# Patient Record
Sex: Female | Born: 1990 | Race: Black or African American | Hispanic: No | Marital: Single | Smoking: Former smoker
Health system: Southern US, Community
[De-identification: ages and names within clinical notes are randomized; demographics above are authoritative.]

## PROBLEM LIST (undated history)

## (undated) DIAGNOSIS — J45909 Unspecified asthma, uncomplicated: Secondary | ICD-10-CM

## (undated) DIAGNOSIS — E119 Type 2 diabetes mellitus without complications: Secondary | ICD-10-CM

---

## 2020-07-19 DIAGNOSIS — Z87898 Personal history of other specified conditions: Secondary | ICD-10-CM | POA: Insufficient documentation

## 2020-07-19 DIAGNOSIS — J45909 Unspecified asthma, uncomplicated: Secondary | ICD-10-CM | POA: Insufficient documentation

## 2020-07-19 DIAGNOSIS — B9689 Other specified bacterial agents as the cause of diseases classified elsewhere: Secondary | ICD-10-CM | POA: Insufficient documentation

## 2020-07-19 DIAGNOSIS — F339 Major depressive disorder, recurrent, unspecified: Secondary | ICD-10-CM | POA: Insufficient documentation

## 2020-07-19 DIAGNOSIS — K5900 Constipation, unspecified: Secondary | ICD-10-CM | POA: Insufficient documentation

## 2020-07-19 DIAGNOSIS — F39 Unspecified mood [affective] disorder: Secondary | ICD-10-CM | POA: Insufficient documentation

## 2020-07-19 DIAGNOSIS — M792 Neuralgia and neuritis, unspecified: Secondary | ICD-10-CM | POA: Insufficient documentation

## 2020-08-17 DIAGNOSIS — E119 Type 2 diabetes mellitus without complications: Secondary | ICD-10-CM | POA: Insufficient documentation

## 2020-08-17 DIAGNOSIS — F50819 Binge eating disorder, unspecified: Secondary | ICD-10-CM | POA: Insufficient documentation

## 2020-08-17 DIAGNOSIS — F17200 Nicotine dependence, unspecified, uncomplicated: Secondary | ICD-10-CM | POA: Insufficient documentation

## 2020-08-19 DIAGNOSIS — F909 Attention-deficit hyperactivity disorder, unspecified type: Secondary | ICD-10-CM | POA: Insufficient documentation

## 2020-08-19 DIAGNOSIS — F259 Schizoaffective disorder, unspecified: Secondary | ICD-10-CM | POA: Insufficient documentation

## 2020-08-19 DIAGNOSIS — F609 Personality disorder, unspecified: Secondary | ICD-10-CM | POA: Insufficient documentation

## 2020-08-19 DIAGNOSIS — F4310 Post-traumatic stress disorder, unspecified: Secondary | ICD-10-CM | POA: Insufficient documentation

## 2020-09-28 ENCOUNTER — Emergency Department (HOSPITAL_COMMUNITY): Payer: Medicaid Other

## 2020-09-28 ENCOUNTER — Encounter (HOSPITAL_COMMUNITY): Payer: Self-pay

## 2020-09-28 ENCOUNTER — Other Ambulatory Visit: Payer: Self-pay

## 2020-09-28 ENCOUNTER — Emergency Department (HOSPITAL_COMMUNITY)
Admission: EM | Admit: 2020-09-28 | Discharge: 2020-09-28 | Disposition: A | Discharge status: Home / Self Care | Payer: Medicaid Other | Attending: Emergency Medicine | Admitting: Emergency Medicine

## 2020-09-28 DIAGNOSIS — R000 Tachycardia, unspecified: Secondary | ICD-10-CM | POA: Insufficient documentation

## 2020-09-28 DIAGNOSIS — Z7984 Long term (current) use of oral hypoglycemic drugs: Secondary | ICD-10-CM | POA: Insufficient documentation

## 2020-09-28 DIAGNOSIS — R0989 Other specified symptoms and signs involving the circulatory and respiratory systems: Secondary | ICD-10-CM | POA: Insufficient documentation

## 2020-09-28 DIAGNOSIS — R0602 Shortness of breath: Secondary | ICD-10-CM | POA: Diagnosis present

## 2020-09-28 DIAGNOSIS — D72829 Elevated white blood cell count, unspecified: Secondary | ICD-10-CM | POA: Diagnosis not present

## 2020-09-28 DIAGNOSIS — R059 Cough, unspecified: Secondary | ICD-10-CM | POA: Diagnosis not present

## 2020-09-28 LAB — COMPREHENSIVE METABOLIC PANEL
ALT: 15 U/L (ref 0–44)
AST: 25 U/L (ref 15–41)
Albumin: 3.8 g/dL (ref 3.5–5.0)
Alkaline Phosphatase: 43 U/L (ref 38–126)
Anion gap: 9 (ref 5–15)
BUN: 8 mg/dL (ref 6–20)
CO2: 26 mmol/L (ref 22–32)
Calcium: 9.5 mg/dL (ref 8.9–10.3)
Chloride: 101 mmol/L (ref 98–111)
Creatinine, Ser: 0.73 mg/dL (ref 0.44–1.00)
GFR, Estimated: >60 mL/min (ref >60)
Glucose, Bld: 191 mg/dL — ABNORMAL HIGH (ref 70–99)
Potassium: 3.8 mmol/L (ref 3.5–5.1)
Sodium: 136 mmol/L (ref 135–145)
Total Bilirubin: <0.1 mg/dL — ABNORMAL LOW (ref 0.3–1.2)
Total Protein: 7.9 g/dL (ref 6.5–8.1)

## 2020-09-28 LAB — CBC WITH DIFFERENTIAL/PLATELET
Abs Immature Granulocytes: 0.07 10*3/uL (ref 0.00–0.07)
Basophils Absolute: 0.1 10*3/uL (ref 0.0–0.1)
Basophils Relative: 0 %
Eosinophils Absolute: 0.3 10*3/uL (ref 0.0–0.5)
Eosinophils Relative: 2 %
HCT: 40 % (ref 36.0–46.0)
Hemoglobin: 12 g/dL (ref 12.0–15.0)
Immature Granulocytes: 1 %
Lymphocytes Relative: 46 %
Lymphs Abs: 6.4 10*3/uL — ABNORMAL HIGH (ref 0.7–4.0)
MCH: 23.6 pg — ABNORMAL LOW (ref 26.0–34.0)
MCHC: 30 g/dL (ref 30.0–36.0)
MCV: 78.7 fL — ABNORMAL LOW (ref 80.0–100.0)
Monocytes Absolute: 0.7 10*3/uL (ref 0.1–1.0)
Monocytes Relative: 5 %
Neutro Abs: 6.6 10*3/uL (ref 1.7–7.7)
Neutrophils Relative %: 46 %
Platelets: 462 10*3/uL — ABNORMAL HIGH (ref 150–400)
RBC: 5.08 MIL/uL (ref 3.87–5.11)
RDW: 18.6 % — ABNORMAL HIGH (ref 11.5–15.5)
WBC: 14.1 10*3/uL — ABNORMAL HIGH (ref 4.0–10.5)
nRBC: 0 % (ref 0.0–0.2)

## 2020-09-28 LAB — HCG, QUANTITATIVE, PREGNANCY: hCG, Beta Chain, Quant, S: <1 m[IU]/mL (ref <5)

## 2020-09-28 MED ORDER — IOHEXOL 350 MG/ML SOLN
100.0000 mL | Freq: Once | INTRAVENOUS | Status: AC | PRN
  Administered 2020-09-28: 100 mL via INTRAVENOUS

## 2020-09-28 MED ORDER — ALBUTEROL SULFATE HFA 108 (90 BASE) MCG/ACT IN AERS
2.0000 | INHALATION_SPRAY | RESPIRATORY_TRACT | Status: DC | PRN
  Administered 2020-09-28: 2 via RESPIRATORY_TRACT
  Filled 2020-09-28: qty 6.7

## 2020-09-28 MED ORDER — ALBUTEROL SULFATE HFA 108 (90 BASE) MCG/ACT IN AERS: 2.0000 | INHALATION_SPRAY | RESPIRATORY_TRACT | Status: DC | PRN

## 2020-09-28 MED ORDER — SODIUM CHLORIDE 0.9 % IV BOLUS
1000.0000 mL | Freq: Once | INTRAVENOUS | Status: AC
  Administered 2020-09-28: 1000 mL via INTRAVENOUS

## 2020-09-28 MED ORDER — ALBUTEROL SULFATE HFA 108 (90 BASE) MCG/ACT IN AERS: 1.0000 | INHALATION_SPRAY | Freq: Four times a day (QID) | RESPIRATORY_TRACT | 0 refills | Status: AC | PRN

## 2020-09-28 NOTE — Discharge Instructions (Addendum)
You are seen in the ER today for your shortness of breath.  Your evaluation in the emergency department was reassuring.  There is no blood clot in your lungs and no signs of infection.  You have been prescribed albuterol inhaler to take as needed at home for wheezing.  Additionally please follow-up with your primary care doctor or with the [facility] listed below who can function as a primary care doctor.  Return to the ER if you develop chest pain, shortness of breath, her heart is racing or feeling palpitations, or develop any new severe symptoms.

## 2020-09-28 NOTE — ED Notes (Signed)
Patient is resting comfortably with family at bedside. 

## 2020-09-28 NOTE — ED Provider Notes (Signed)
[facility]-EMERGENCY DEPT Provider Note   CSN: [id number] Arrival date & time: 09/28/20  0204     History Chief Complaint  Patient presents with  . Shortness of Breath    Melissa Castillo is a 30 y.o. female.  Patient to ED for evaluation of a choking episode on waking tonight. She reports eating chips while in bed. She woke up with a choking sensation like the chips were stuck in her throat and started coughing. She feels she may have aspirated. No recent fever.   The history is provided by the patient. No language interpreter was used.       History reviewed. No pertinent past medical history.  There are no problems to display for this patient.   History reviewed. No pertinent surgical history.   OB History   No obstetric history on file.     History reviewed. No pertinent family history.     Home Medications Prior to Admission medications   Medication Sig Start Date End Date Taking? Authorizing Provider  escitalopram (LEXAPRO) 20 MG tablet Take 1 tablet by mouth daily. 09/21/20  Yes [provider]  gabapentin (NEURONTIN) 300 MG capsule Take 1 capsule by mouth 2 (two) times daily as needed (pain). 09/21/20  Yes [provider]  metFORMIN (GLUCOPHAGE) 500 MG tablet Take 1 tablet by mouth daily. 09/13/20  Yes [provider]  methadone (DOLOPHINE) 10 MG/5ML solution Take 135 mg by mouth daily.   Yes [provider]  QUEtiapine (SEROQUEL) 50 MG tablet Take 50 mg by mouth at bedtime as needed (sleep). 07/13/20  Yes [provider]    Allergies    Patient has no known allergies.  Review of Systems   Review of Systems  Constitutional: Negative for chills and fever.  HENT: Negative.   Respiratory: Positive for cough, choking and shortness of breath.   Cardiovascular: Negative.   Gastrointestinal: Negative.  Negative for vomiting.  Musculoskeletal: Negative.   Skin: Negative.   Neurological: Negative.      Physical Exam Updated Vital Signs BP 129/63 (BP Location: Right Arm)   Pulse (!) 117   Temp 99 F (37.2 C) (Oral)   Resp (!) 22   LMP  (LMP Unknown)   SpO2 94%   Physical Exam Vitals and nursing note reviewed.  Constitutional:      Appearance: She is well-developed. She is obese.  HENT:     Head: Normocephalic.  Cardiovascular:     Rate and Rhythm: Regular rhythm. Tachycardia present.  Pulmonary:     Effort: Pulmonary effort is normal.     Breath sounds: Normal breath sounds. No wheezing, rhonchi or rales.  Abdominal:     General: Bowel sounds are normal.     Palpations: Abdomen is soft.     Tenderness: There is no abdominal tenderness. There is no guarding or rebound.  Musculoskeletal:        General: Normal range of motion.     Cervical back: Normal range of motion and neck supple.     Right lower leg: No tenderness.     Left lower leg: No tenderness.  Skin:    General: Skin is warm and dry.     Findings: No rash.  Neurological:     Mental Status: She is alert.     ED Results / Procedures / Treatments   Labs (all labs ordered are listed, but only abnormal results are displayed) Labs Reviewed  CBC WITH DIFFERENTIAL/PLATELET  COMPREHENSIVE METABOLIC PANEL  Results for orders placed or performed during the hospital encounter of 09/28/20  CBC with Differential  Result Value Ref Range   WBC 14.1 (H) 4.0 - 10.5 K/uL   RBC 5.08 3.87 - 5.11 MIL/uL   Hemoglobin 12.0 12.0 - 15.0 g/dL   HCT 97.5 88.3 - 25.4 %   MCV 78.7 (L) 80.0 - 100.0 fL   MCH 23.6 (L) 26.0 - 34.0 pg   MCHC 30.0 30.0 - 36.0 g/dL   RDW 98.2 (H) 64.1 - 58.3 %   Platelets 462 (H) 150 - 400 K/uL   nRBC 0.0 0.0 - 0.2 %   Neutrophils Relative % 46 %   Neutro Abs 6.6 1.7 - 7.7 K/uL   Lymphocytes Relative 46 %   Lymphs Abs 6.4 (H) 0.7 - 4.0 K/uL   Monocytes Relative 5 %   Monocytes Absolute 0.7 0.1 - 1.0 K/uL   Eosinophils Relative 2 %   Eosinophils Absolute 0.3 0.0 - 0.5 K/uL   Basophils  Relative 0 %   Basophils Absolute 0.1 0.0 - 0.1 K/uL   Immature Granulocytes 1 %   Abs Immature Granulocytes 0.07 0.00 - 0.07 K/uL  Comprehensive metabolic panel  Result Value Ref Range   Sodium 136 135 - 145 mmol/L   Potassium 3.8 3.5 - 5.1 mmol/L   Chloride 101 98 - 111 mmol/L   CO2 26 22 - 32 mmol/L   Glucose, Bld 191 (H) 70 - 99 mg/dL   BUN 8 6 - 20 mg/dL   Creatinine, Ser 0.94 0.44 - 1.00 mg/dL   Calcium 9.5 8.9 - 07.6 mg/dL   Total Protein 7.9 6.5 - 8.1 g/dL   Albumin 3.8 3.5 - 5.0 g/dL   AST 25 15 - 41 U/L   ALT 15 0 - 44 U/L   Alkaline Phosphatase 43 38 - 126 U/L   Total Bilirubin <0.1 (L) 0.3 - 1.2 mg/dL   GFR, Estimated >80 >88 mL/min   Anion gap 9 5 - 15    EKG EKG Interpretation  Date/Time:  Wednesday September 28 2020 04:32:36 EDT Ventricular Rate:  118 PR Interval:  141 QRS Duration: 88 QT Interval:  345 QTC Calculation: 484 R Axis:   25 Text Interpretation: Sinus tachycardia Borderline T wave abnormalities Borderline prolonged QT interval No significant change was found Confirmed by Dione Booze ([id number]) on 09/28/2020 4:42:04 AM   Radiology DG Chest 2 View  Result Date: 09/28/2020 CLINICAL DATA:  Sudden onset wheezing and shortness of breath EXAM: CHEST - 2 VIEW COMPARISON:  None. FINDINGS: Low volume chest with interstitial crowding. No focal opacity, edema, effusion, or air leak. Normal heart size. Artifact from EKG leads. IMPRESSION: Limited low volume chest.  No focal airspace disease or edema. Electronically Signed   By: Marnee Spring M.D.   On: 09/28/2020 04:54    Procedures Procedures   Medications Ordered in ED Medications  albuterol (VENTOLIN HFA) 108 (90 Base) MCG/ACT inhaler 2 puff (has no administration in time range)    ED Course  I have reviewed the triage vital signs and the nursing notes.  Pertinent labs & imaging results that were available during my care of the patient were reviewed by me and considered in my medical decision making  (see chart for details).    MDM Rules/Calculators/A&P                          Patient to ED after choking episode earlier this evening.  CXR negative for signs of aspiration. The patient has been sleeping on multiple rechecks. She is persistently tachycardic to 118-120. NAD.   CBC with mild leukocystosis of 14.1, otherwise labs are negative. UA pending. EKG non-ischemic.   She reports she drove from [place] within the last 2 weeks. With SOB episode and persistent tachycardia, consider PE. CTA ordered for further evaluation. This was discussed with the patient who is comfortable with the study and has no questions.   Patient care signed out to [facility], PA-C, pending CTA results. Anticipate discharge home. She will need an inhaler on discharge and referral to PCP.   Final Clinical Impression(s) / ED Diagnoses Final diagnoses:  None   1. SOB 2. Tachycardia   Rx / DC Orders ED Discharge Orders    None       Danne Harbor 09/28/20 0711    Dione Booze, MD 09/28/20 [phone]

## 2020-09-28 NOTE — ED Triage Notes (Signed)
Pt BIB EMS Per EMS pt came from home, she was sleeping, woke up around 1 am. She vomited in her sleep and feels that she aspirated. Pt states that she couldn't breath. Bilateral wheezing noted, started on albuterol treatment. Oxygen saturation at 99% after treatment. Total of 2 neb treatments by EMS. Prescription for inhaler at home but has been without it due to insurance issues.   Vitals 132/86 BP 110 HR  99% room air

## 2020-09-28 NOTE — ED Provider Notes (Signed)
  Physical Exam  BP 138/78 (BP Location: Right Arm)   Pulse (!) 101   Temp (!) 97.2 F (36.2 C) (Oral)   Resp 19   LMP 09/05/2020 Comment: negative HCG blood test 09-28-2020  SpO2 99%   Physical Exam  ED Course/Procedures     Procedures  MDM   Care of this patient assumed from preceding ED provider, Elpidio Anis, PA-C at time of shift change.  Please see her associated note for further insight into the patient's ED course.  In brief patient experienced episode of shortness of breath last night sensation that she was choking.  She was wheezing upon arrival and given nebulizer treatment.  Patient's been persistently tachycardic throughout her stay in the ED and recently traveled from [place] by car.  For this reason CT angiogram was ordered to rule out pulmonary embolus.  This study is pending at time of shift change; if negative disposition plan likely discharge home with albuterol inhaler and PCP follow-up.  CTA negative for acute pulmonary embolus but does have some right-sided atelectasis.  Tachycardia improved to 101 bpm previously in the 120s.  Given reassuring vital signs and CTA today in the emergency department, no further work-up is warranted at this time.  Will discharge with albuterol inhaler and PCP follow-up.  Melissa Castillo voiced understanding of her medical evaluation and treatment plan.  Each of her questions was answered to her expressed satisfaction.  Strict return precautions given.  Patient is well-appearing, stable, and appropriate for discharge at this time.  This chart was dictated using voice recognition software, Dragon. Despite the best efforts of this provider to proofread and correct errors, errors may still occur which can change documentation meaning.    Sherrilee Gilles 09/28/20 2725    [name], MD 09/30/20 [phone]

## 2021-01-04 DIAGNOSIS — S3992XA Unspecified injury of lower back, initial encounter: Secondary | ICD-10-CM | POA: Insufficient documentation

## 2021-01-04 DIAGNOSIS — F1120 Opioid dependence, uncomplicated: Secondary | ICD-10-CM | POA: Insufficient documentation

## 2021-04-06 ENCOUNTER — Other Ambulatory Visit: Payer: Self-pay | Admitting: Orthopedic Surgery

## 2021-04-06 DIAGNOSIS — M545 Low back pain, unspecified: Secondary | ICD-10-CM

## 2021-04-06 DIAGNOSIS — M542 Cervicalgia: Secondary | ICD-10-CM

## 2021-04-26 ENCOUNTER — Other Ambulatory Visit: Payer: Medicaid Other

## 2021-04-28 ENCOUNTER — Ambulatory Visit
Admission: RE | Admit: 2021-04-28 | Discharge: 2021-04-28 | Disposition: A | Discharge status: Home / Self Care | Payer: Medicaid Other | Source: Ambulatory Visit | Attending: Orthopedic Surgery | Admitting: Orthopedic Surgery

## 2021-04-28 ENCOUNTER — Other Ambulatory Visit: Payer: Self-pay

## 2021-04-28 DIAGNOSIS — M545 Low back pain, unspecified: Secondary | ICD-10-CM

## 2021-04-28 DIAGNOSIS — M542 Cervicalgia: Secondary | ICD-10-CM

## 2021-08-18 ENCOUNTER — Telehealth: Payer: Medicaid Other | Admitting: Family

## 2021-08-18 NOTE — Progress Notes (Signed)
Attempted to connect with patient. Called and her current phone number is not active. Will close visit.  ? ?Jannifer Rodney, FNP ? ?

## 2021-08-21 ENCOUNTER — Ambulatory Visit (HOSPITAL_COMMUNITY)
Admission: EM | Admit: 2021-08-21 | Discharge: 2021-08-21 | Discharge status: Against Medical Advice | Payer: Medicaid Other

## 2021-10-15 ENCOUNTER — Emergency Department (HOSPITAL_COMMUNITY)
Admission: EM | Admit: 2021-10-15 | Discharge: 2021-10-15 | Disposition: A | Discharge status: Home / Self Care | Payer: Medicaid Other | Attending: Emergency Medicine | Admitting: Emergency Medicine

## 2021-10-15 DIAGNOSIS — K029 Dental caries, unspecified: Secondary | ICD-10-CM | POA: Diagnosis not present

## 2021-10-15 DIAGNOSIS — Z7984 Long term (current) use of oral hypoglycemic drugs: Secondary | ICD-10-CM | POA: Insufficient documentation

## 2021-10-15 DIAGNOSIS — K0889 Other specified disorders of teeth and supporting structures: Secondary | ICD-10-CM

## 2021-10-15 MED ORDER — CLINDAMYCIN HCL 300 MG PO CAPS: 300.0000 mg | ORAL_CAPSULE | Freq: Three times a day (TID) | ORAL | 0 refills | Status: AC

## 2021-10-15 MED ORDER — CLINDAMYCIN HCL 150 MG PO CAPS
300.0000 mg | ORAL_CAPSULE | Freq: Once | ORAL | Status: AC
  Administered 2021-10-15: 300 mg via ORAL
  Filled 2021-10-15: qty 2

## 2021-10-15 MED ORDER — OXYCODONE HCL 5 MG PO TABS: 5.0000 mg | ORAL_TABLET | Freq: Four times a day (QID) | ORAL | 0 refills | Status: DC | PRN

## 2021-10-15 MED ORDER — HYDROCODONE-ACETAMINOPHEN 5-325 MG PO TABS
1.0000 | ORAL_TABLET | Freq: Once | ORAL | Status: AC
  Administered 2021-10-15: 1 via ORAL
  Filled 2021-10-15: qty 1

## 2021-10-15 NOTE — ED Provider Notes (Signed)
?[facility] EMERGENCY DEPARTMENT ?Provider Note ? ? ?CSN: [id number] ?Arrival date & time: 10/15/21  1554 ? ?  ? ?History ? ?Chief Complaint  ?Patient presents with  ? Dental Pain  ? ? ?Melissa Castillo is a 31 y.o. female. ? ?The history is provided by the patient.  ?Dental Pain ?Location:  Lower ?Quality:  Constant ?Severity:  Mild ?Onset quality:  Gradual ?Timing:  Constant ?Progression:  Unchanged ?Chronicity:  New ?Context: dental caries   ?Relieved by:  Nothing ?Worsened by:  Nothing ?Associated symptoms: no congestion, no difficulty swallowing, no drooling, no facial pain, no facial swelling, no fever, no gum swelling, no headaches, no neck pain, no neck swelling, no oral bleeding, no oral lesions and no trismus   ? ?  ? ?Home Medications ?Prior to Admission medications   ?Medication Sig Start Date End Date Taking? Authorizing Provider  ?clindamycin (CLEOCIN) 300 MG capsule Take 1 capsule (300 mg total) by mouth 3 (three) times daily for 10 days. 10/15/21 10/25/21 Yes Arlett Goold, DO  ?albuterol (VENTOLIN HFA) 108 (90 Base) MCG/ACT inhaler Inhale 1-2 puffs into the lungs every 6 (six) hours as needed for wheezing or shortness of breath. 09/28/20   Sponseller, Lupe Carney R, PA-C  ?escitalopram (LEXAPRO) 20 MG tablet Take 20 mg by mouth daily. 09/21/20   [provider]  ?gabapentin (NEURONTIN) 300 MG capsule Take 300 mg by mouth 2 (two) times daily as needed (pain). 09/21/20   [provider]  ?metFORMIN (GLUCOPHAGE) 500 MG tablet Take 500 mg by mouth daily. 09/13/20   [provider]  ?methadone (DOLOPHINE) 10 MG/5ML solution Take 115 mg by mouth daily. Per [facility] LPN verified @ [place] [phone] had one take home dose left for today    [provider]  ?QUEtiapine (SEROQUEL) 50 MG tablet Take 50 mg by mouth at bedtime as needed (sleep). 07/13/20   [provider]  ?   ? ?Allergies    ?Patient has no known allergies.   ? ?Review of Systems   ?Review of  Systems  ?Constitutional:  Negative for fever.  ?HENT:  Negative for congestion, drooling, facial swelling and mouth sores.   ?Musculoskeletal:  Negative for neck pain.  ?Neurological:  Negative for headaches.  ? ?Physical Exam ?Updated Vital Signs ?BP (!) 151/99 (BP Location: Right Arm)   Pulse 100   Temp 98 ?F (36.7 ?C) (Oral)   Resp 20   SpO2 100%  ?Physical Exam ?Constitutional:   ?   General: She is not in acute distress. ?   Appearance: She is not ill-appearing.  ?HENT:  ?   Head: Normocephalic and atraumatic.  ?   Comments: Patient with dental caries throughout on exam but particularly in the right lower teeth ?   Nose: Nose normal.  ?   Mouth/Throat:  ?   Mouth: Mucous membranes are moist.  ?   Pharynx: No oropharyngeal exudate.  ?Eyes:  ?   Extraocular Movements: Extraocular movements intact.  ?   Pupils: Pupils are equal, round, and reactive to light.  ?Cardiovascular:  ?   Pulses: Normal pulses.  ?Musculoskeletal:  ?   Cervical back: Normal range of motion.  ?Lymphadenopathy:  ?   Cervical: No cervical adenopathy.  ?Neurological:  ?   Mental Status: She is alert.  ? ? ?ED Results / Procedures / Treatments   ?Labs ?(all labs ordered are listed, but only abnormal results are displayed) ?Labs Reviewed - No data to display ? ?EKG ?None ? ?  Radiology ?No results found. ? ?Procedures ?Procedures  ? ? ?Medications Ordered in ED ?Medications  ?HYDROcodone-acetaminophen (NORCO/VICODIN) 5-325 MG per tablet 1 tablet (1 tablet Oral Given 10/15/21 1618)  ?clindamycin (CLEOCIN) capsule 300 mg (300 mg Oral Given 10/15/21 1618)  ? ? ?ED Course/ Medical Decision Making/ A&P ?  ?                        ?Medical Decision Making ?Risk ?Prescription drug management. ? ? ?Patient here with dental pain.  No signs of abscess on exam.  Some dental caries especially in the right lower mouth.  Will prescribe clindamycin.  Will give resources for dentistry.  No concern for deep space infection. ? ? ? ? ? ? ? ?Final Clinical  Impression(s) / ED Diagnoses ?Final diagnoses:  ?Pain, dental  ? ? ?Rx / DC Orders ?ED Discharge Orders   ? ?      Ordered  ?  oxyCODONE (ROXICODONE) 5 MG immediate release tablet  Every 6 hours PRN,   Status:  Discontinued       ? 10/15/21 1616  ?  clindamycin (CLEOCIN) 300 MG capsule  3 times daily       ? 10/15/21 1616  ? ?  ?  ? ?  ? ? ?  ?Virgina Norfolk, DO ?10/15/21 1634 ? ?

## 2021-10-15 NOTE — Discharge Instructions (Addendum)
Follow-up with dentist.  Take antibiotic as prescribed.   Recommend Tylenol and ibuprofen for pain as well. ?

## 2021-10-15 NOTE — ED Triage Notes (Signed)
Dental pain x 3 days radiating to right ear. Hx of dental abscess with the same presentation. Airway intact.  ?

## 2022-10-11 DIAGNOSIS — F3131 Bipolar disorder, current episode depressed, mild: Secondary | ICD-10-CM | POA: Diagnosis not present

## 2022-10-11 DIAGNOSIS — F4310 Post-traumatic stress disorder, unspecified: Secondary | ICD-10-CM | POA: Diagnosis not present

## 2022-11-27 DIAGNOSIS — E119 Type 2 diabetes mellitus without complications: Secondary | ICD-10-CM | POA: Diagnosis not present
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
## 2023-04-08 DIAGNOSIS — R29818 Other symptoms and signs involving the nervous system: Secondary | ICD-10-CM | POA: Insufficient documentation

## 2023-04-08 DIAGNOSIS — Z720 Tobacco use: Secondary | ICD-10-CM | POA: Insufficient documentation

## 2023-04-13 DIAGNOSIS — E1140 Type 2 diabetes mellitus with diabetic neuropathy, unspecified: Secondary | ICD-10-CM | POA: Diagnosis not present

## 2023-04-13 DIAGNOSIS — K219 Gastro-esophageal reflux disease without esophagitis: Secondary | ICD-10-CM | POA: Diagnosis not present

## 2023-04-13 DIAGNOSIS — Z833 Family history of diabetes mellitus: Secondary | ICD-10-CM | POA: Diagnosis not present

## 2023-04-13 DIAGNOSIS — Z008 Encounter for other general examination: Secondary | ICD-10-CM | POA: Diagnosis not present

## 2023-04-13 DIAGNOSIS — F259 Schizoaffective disorder, unspecified: Secondary | ICD-10-CM | POA: Diagnosis not present

## 2023-04-13 DIAGNOSIS — R030 Elevated blood-pressure reading, without diagnosis of hypertension: Secondary | ICD-10-CM | POA: Diagnosis not present

## 2023-04-13 DIAGNOSIS — Z7951 Long term (current) use of inhaled steroids: Secondary | ICD-10-CM | POA: Diagnosis not present

## 2023-04-13 DIAGNOSIS — Z809 Family history of malignant neoplasm, unspecified: Secondary | ICD-10-CM | POA: Diagnosis not present

## 2023-04-13 DIAGNOSIS — Z91040 Latex allergy status: Secondary | ICD-10-CM | POA: Diagnosis not present

## 2023-04-13 DIAGNOSIS — J45909 Unspecified asthma, uncomplicated: Secondary | ICD-10-CM | POA: Diagnosis not present

## 2023-04-13 DIAGNOSIS — F1120 Opioid dependence, uncomplicated: Secondary | ICD-10-CM | POA: Diagnosis not present

## 2023-04-13 DIAGNOSIS — Z7985 Long-term (current) use of injectable non-insulin antidiabetic drugs: Secondary | ICD-10-CM | POA: Diagnosis not present

## 2023-04-13 DIAGNOSIS — Z720 Tobacco use: Secondary | ICD-10-CM | POA: Diagnosis not present

## 2023-04-16 DIAGNOSIS — F1120 Opioid dependence, uncomplicated: Secondary | ICD-10-CM | POA: Diagnosis not present

## 2023-04-22 DIAGNOSIS — F1120 Opioid dependence, uncomplicated: Secondary | ICD-10-CM | POA: Diagnosis not present

## 2023-04-29 DIAGNOSIS — F1120 Opioid dependence, uncomplicated: Secondary | ICD-10-CM | POA: Diagnosis not present

## 2023-05-06 DIAGNOSIS — F1120 Opioid dependence, uncomplicated: Secondary | ICD-10-CM | POA: Diagnosis not present

## 2023-05-13 DIAGNOSIS — F1120 Opioid dependence, uncomplicated: Secondary | ICD-10-CM | POA: Diagnosis not present

## 2023-05-20 DIAGNOSIS — F1120 Opioid dependence, uncomplicated: Secondary | ICD-10-CM | POA: Diagnosis not present

## 2023-05-24 DIAGNOSIS — F1120 Opioid dependence, uncomplicated: Secondary | ICD-10-CM | POA: Diagnosis not present

## 2023-05-27 DIAGNOSIS — F1120 Opioid dependence, uncomplicated: Secondary | ICD-10-CM | POA: Diagnosis not present

## 2023-06-03 DIAGNOSIS — F1120 Opioid dependence, uncomplicated: Secondary | ICD-10-CM | POA: Diagnosis not present

## 2023-06-10 DIAGNOSIS — F1120 Opioid dependence, uncomplicated: Secondary | ICD-10-CM | POA: Diagnosis not present

## 2023-06-17 DIAGNOSIS — F1120 Opioid dependence, uncomplicated: Secondary | ICD-10-CM | POA: Diagnosis not present

## 2023-06-24 DIAGNOSIS — F1120 Opioid dependence, uncomplicated: Secondary | ICD-10-CM | POA: Diagnosis not present

## 2023-07-01 DIAGNOSIS — F1120 Opioid dependence, uncomplicated: Secondary | ICD-10-CM | POA: Diagnosis not present

## 2023-07-08 DIAGNOSIS — F1120 Opioid dependence, uncomplicated: Secondary | ICD-10-CM | POA: Diagnosis not present

## 2023-07-25 ENCOUNTER — Encounter (HOSPITAL_COMMUNITY): Payer: Self-pay

## 2023-07-25 ENCOUNTER — Emergency Department (HOSPITAL_COMMUNITY): Payer: 59

## 2023-07-25 ENCOUNTER — Emergency Department (HOSPITAL_COMMUNITY)
Admission: EM | Admit: 2023-07-25 | Discharge: 2023-07-25 | Disposition: A | Discharge status: Home / Self Care | Payer: 59 | Attending: Emergency Medicine | Admitting: Emergency Medicine

## 2023-07-25 ENCOUNTER — Other Ambulatory Visit: Payer: Self-pay

## 2023-07-25 DIAGNOSIS — Z20822 Contact with and (suspected) exposure to covid-19: Secondary | ICD-10-CM | POA: Insufficient documentation

## 2023-07-25 DIAGNOSIS — R519 Headache, unspecified: Secondary | ICD-10-CM | POA: Insufficient documentation

## 2023-07-25 LAB — RESP PANEL BY RT-PCR (RSV, FLU A&B, COVID)  RVPGX2
Influenza A by PCR: NEGATIVE
Influenza B by PCR: NEGATIVE
Resp Syncytial Virus by PCR: NEGATIVE
SARS Coronavirus 2 by RT PCR: NEGATIVE

## 2023-07-25 MED ORDER — DEXAMETHASONE SODIUM PHOSPHATE 10 MG/ML IJ SOLN
10.0000 mg | Freq: Once | INTRAMUSCULAR | Status: AC
  Administered 2023-07-25: 10 mg via INTRAMUSCULAR
  Filled 2023-07-25: qty 1

## 2023-07-25 MED ORDER — PROCHLORPERAZINE MALEATE 10 MG PO TABS
10.0000 mg | ORAL_TABLET | Freq: Once | ORAL | Status: AC
  Administered 2023-07-25: 10 mg via ORAL
  Filled 2023-07-25: qty 1

## 2023-07-25 MED ORDER — ONDANSETRON 4 MG PO TBDP
4.0000 mg | ORAL_TABLET | Freq: Once | ORAL | Status: AC
  Administered 2023-07-25: 4 mg via ORAL
  Filled 2023-07-25: qty 1

## 2023-07-25 MED ORDER — KETOROLAC TROMETHAMINE 30 MG/ML IJ SOLN
30.0000 mg | Freq: Once | INTRAMUSCULAR | Status: AC
  Administered 2023-07-25: 30 mg via INTRAMUSCULAR
  Filled 2023-07-25: qty 1

## 2023-07-25 MED ORDER — ACETAMINOPHEN 500 MG PO TABS
1000.0000 mg | ORAL_TABLET | Freq: Once | ORAL | Status: AC
  Administered 2023-07-25: 1000 mg via ORAL
  Filled 2023-07-25: qty 2

## 2023-07-25 NOTE — ED Provider Notes (Signed)
Accepted handoff at shift change from [facility]. Please see prior provider note for full HPI.  Briefly: Patient is a 33 y.o. female who presents to the ER for bad headache since last night. Tried tylenol without relief.  DDX/Plan: received toradol and decadron around 0445. On reevaluation, no improvement. Plan for more meds and reevaluate. CT head looks unremarkable, no meningeal signs.   Physical Exam  BP (!) 152/73   Pulse (!) 104   Temp 98.3 F (36.8 C) (Oral)   Resp 18   Ht 5\' 7"  (1.702 m)   Wt 122.9 kg   LMP 07/10/2023   SpO2 97%   BMI 42.44 kg/m   Physical Exam Vitals and nursing note reviewed.  Constitutional:      Appearance: Normal appearance.  HENT:     Head: Normocephalic and atraumatic.  Eyes:     Conjunctiva/sclera: Conjunctivae normal.  Pulmonary:     Effort: Pulmonary effort is normal. No respiratory distress.  Musculoskeletal:     Cervical back: Full passive range of motion without pain.  Skin:    General: Skin is warm and dry.  Neurological:     Mental Status: She is alert.  Psychiatric:        Mood and Affect: Mood normal.        Behavior: Behavior normal.    ED Course / MDM    Medical Decision Making Amount and/or Complexity of Data Reviewed Radiology: ordered.  Risk OTC drugs. Prescription drug management.  0730 -- pt resting comfortably in exam bed. States headache is improved but still there, still feels "wobbly" when she stands up. Mentions nausea and nasal congestion. Is requesting I call her methadone clinic to let them know she missed her appointment. Will order tylenol and zofran.   3244 -- After consideration of the diagnostic results and the patients response to treatment, I feel that emergency department workup does not suggest an emergent condition requiring admission or immediate intervention beyond what has been performed at this time. The plan is: discharge to home, recommend continuing OTC meds for bad headache. Suspect she  has viral infection, could be too early for RVP accuracy. No fever, no meningeal signs. The patient is safe for discharge and has been instructed to return immediately for worsening symptoms, change in symptoms or any other concerns.   Su Monks, PA-C 07/25/23 0826    Melene Plan, DO 07/25/23 1234

## 2023-07-25 NOTE — ED Provider Notes (Signed)
[place] EMERGENCY DEPARTMENT AT [facility] Provider Note   CSN: [id number] Arrival date & time: 07/25/23  0353     History  Chief Complaint  Patient presents with   Headache    Melissa Castillo is a 33 y.o. female.  Patient presents to the emergency department complaining of a headache.  Patient describes a headache which began at approximately 10 PM, severe, throbbing, behind the eyes.  She endorses trying Tylenol with little relief.  The patient endorses starting nicotine patches over the past few days to help stop smoking and states that she has been using them according to the label.  She denies fever, neck pain, neck stiffness.  She does endorse photophobia.  Past medical history significant for asthma, neuralgia and neuritis, unspecified mood disorder, major depressive disorder   Headache      Home Medications Prior to Admission medications   Medication Sig Start Date End Date Taking? Authorizing Provider  albuterol (VENTOLIN HFA) 108 (90 Base) MCG/ACT inhaler Inhale 1-2 puffs into the lungs every 6 (six) hours as needed for wheezing or shortness of breath. 09/28/20   Sponseller, Lupe Carney R, PA-C  escitalopram (LEXAPRO) 20 MG tablet Take 20 mg by mouth daily. 09/21/20   [provider]  gabapentin (NEURONTIN) 300 MG capsule Take 300 mg by mouth 2 (two) times daily as needed (pain). 09/21/20   [provider]  metFORMIN (GLUCOPHAGE) 500 MG tablet Take 500 mg by mouth daily. 09/13/20   [provider]  methadone (DOLOPHINE) 10 MG/5ML solution Take 115 mg by mouth daily. Per [facility] LPN verified @ [place] [phone] had one take home dose left for today    [provider]  QUEtiapine (SEROQUEL) 50 MG tablet Take 50 mg by mouth at bedtime as needed (sleep). 07/13/20   [provider]      Allergies    Patient has no known allergies.    Review of Systems   Review of Systems  Neurological:  Positive for headaches.     Physical Exam Updated Vital Signs BP 134/83 (BP Location: Right Arm)   Pulse (!) 105   Temp 98.1 F (36.7 C) (Oral)   Resp 18   Ht 5\' 7"  (1.702 m)   Wt 122.9 kg   LMP 07/10/2023   SpO2 100%   BMI 42.44 kg/m  Physical Exam Vitals and nursing note reviewed.  Constitutional:      Appearance: She is obese.  HENT:     Head: Normocephalic and atraumatic.  Eyes:     Conjunctiva/sclera: Conjunctivae normal.     Pupils: Pupils are equal, round, and reactive to light.  Cardiovascular:     Rate and Rhythm: Normal rate and regular rhythm.  Pulmonary:     Effort: Pulmonary effort is normal. No respiratory distress.     Breath sounds: Normal breath sounds.  Abdominal:     Palpations: Abdomen is soft.     Tenderness: There is no abdominal tenderness.  Musculoskeletal:        General: No swelling.     Cervical back: Normal range of motion and neck supple. No rigidity.  Skin:    General: Skin is warm and dry.     Capillary Refill: Capillary refill takes less than 2 seconds.  Neurological:     Mental Status: She is alert.  Psychiatric:        Mood and Affect: Mood normal.     ED Results / Procedures / Treatments   Labs (all labs  ordered are listed, but only abnormal results are displayed) Labs Reviewed  RESP PANEL BY RT-PCR (RSV, FLU A&B, COVID)  RVPGX2  POC URINE PREG, ED    EKG None  Radiology CT Head Wo Contrast Result Date: 07/25/2023 CLINICAL DATA:  Headache, sudden and severe. Headache behind the eyes EXAM: CT HEAD WITHOUT CONTRAST TECHNIQUE: Contiguous axial images were obtained from the base of the skull through the vertex without intravenous contrast. RADIATION DOSE REDUCTION: This exam was performed according to the departmental dose-optimization program which includes automated exposure control, adjustment of the mA and/or kV according to patient size and/or use of iterative reconstruction technique. COMPARISON:  None Available. FINDINGS: Brain: No evidence of  acute infarction, hemorrhage, hydrocephalus, extra-axial collection or mass effect. 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 Vascular: No hyperdense vessel or unexpected calcification. Skull: Normal. Negative for fracture or focal lesion. Sinuses/Orbits: No acute finding.  Clear paranasal sinuses IMPRESSION: No acute finding or specific cause for headache. Electronically Signed   By: Tiburcio Pea M.D.   On: 07/25/2023 05:08    Procedures Procedures    Medications Ordered in ED Medications  prochlorperazine (COMPAZINE) tablet 10 mg (has no administration in time range)  ketorolac (TORADOL) 30 MG/ML injection 30 mg (30 mg Intramuscular Given 07/25/23 0448)  dexamethasone (DECADRON) injection 10 mg (10 mg Intramuscular Given 07/25/23 0447)    ED Course/ Medical Decision Making/ A&P                                 Medical Decision Making Amount and/or Complexity of Data Reviewed Radiology: ordered.  Risk Prescription drug management.   This patient presents to the ED for concern of headache, this involves an extensive number of treatment options, and is a complaint that carries with it a high risk of complications and morbidity.  The differential diagnosis includes tension headache, migraine, cluster headache, intracranial abnormality, meningitis, others   Co morbidities that complicate the patient evaluation  Obesity, neuralgia   Additional history obtained:   External records from outside source obtained and reviewed including outside health notes showing medical history   Imaging Studies ordered:  I ordered imaging studies including CT head without contrast I independently visualized and interpreted imaging which showed no acute findings I agree with the radiologist interpretation   Problem List / ED Course / Critical interventions / Medication management   I ordered medication including Toradol,  Decadron, Compazine for headache Reevaluation of the patient after these medicines showed that the patient stayed the same I have reviewed the patients home medicines and have made adjustments as needed   Test / Admission - Considered:  Patient care being transferred to Lorin Roemhildt, PA-C.  Disposition pending reassessment after medication, results of testing         Final Clinical Impression(s) / ED Diagnoses Final diagnoses:  None    Rx / DC Orders ED Discharge Orders     None         Pamala Duffel 07/25/23 1610    Tilden Fossa, MD 07/25/23 [phone]

## 2023-07-25 NOTE — Discharge Instructions (Addendum)
You are seen in the emergency department for a headache.  I am glad that you were starting to have some improvement with medications given.  I suspect you likely have a viral infection which could be contributing to your headache, as you have been having some nasal congestion.  You tested negative for flu, COVID, and RSV.  However I have seen several folks with either a different virus, or it is too early for you to test positive.  I recommend taking up to 800 mg of ibuprofen every 6 hours, and up to 1000 mg of Tylenol every 6 hours.  You can alternate the 2.  That would mean that you got a dose of Tylenol at 7:30am, you can take a dose of ibuprofen at 10:30am, and dose of Tylenol again at 1:30pm.  I called your methadone clinic.  They request that you bring her hospital paperwork to them tomorrow at your scheduled appointment.  The CT scan of your head was normal.   Continue to monitor how you're doing and return to the ER for new or worsening symptoms.

## 2023-07-25 NOTE — ED Triage Notes (Signed)
Complaining of a headache behind her eyes that started around 11 last night. Said that it is throbbing, had tylenol at 10 pm.  York Spaniel she recently started to use nicotine patches to help stop smoking cigarette.

## 2023-07-26 ENCOUNTER — Other Ambulatory Visit: Payer: Self-pay

## 2023-07-26 ENCOUNTER — Encounter (HOSPITAL_COMMUNITY): Payer: Self-pay

## 2023-07-26 ENCOUNTER — Observation Stay (HOSPITAL_COMMUNITY)
Admission: EM | Admit: 2023-07-26 | Discharge: 2023-07-28 | Disposition: A | Discharge status: Home / Self Care | Payer: 59 | Attending: Internal Medicine | Admitting: Internal Medicine

## 2023-07-26 DIAGNOSIS — Z6841 Body Mass Index (BMI) 40.0 and over, adult: Secondary | ICD-10-CM | POA: Diagnosis not present

## 2023-07-26 DIAGNOSIS — N949 Unspecified condition associated with female genital organs and menstrual cycle: Secondary | ICD-10-CM

## 2023-07-26 DIAGNOSIS — E66813 Obesity, class 3: Secondary | ICD-10-CM | POA: Diagnosis not present

## 2023-07-26 DIAGNOSIS — F909 Attention-deficit hyperactivity disorder, unspecified type: Secondary | ICD-10-CM | POA: Insufficient documentation

## 2023-07-26 DIAGNOSIS — G030 Nonpyogenic meningitis: Principal | ICD-10-CM | POA: Diagnosis present

## 2023-07-26 DIAGNOSIS — Z79891 Long term (current) use of opiate analgesic: Secondary | ICD-10-CM | POA: Diagnosis not present

## 2023-07-26 DIAGNOSIS — J45909 Unspecified asthma, uncomplicated: Secondary | ICD-10-CM | POA: Insufficient documentation

## 2023-07-26 DIAGNOSIS — Z79899 Other long term (current) drug therapy: Secondary | ICD-10-CM | POA: Diagnosis not present

## 2023-07-26 DIAGNOSIS — Z1152 Encounter for screening for COVID-19: Secondary | ICD-10-CM | POA: Insufficient documentation

## 2023-07-26 DIAGNOSIS — R519 Headache, unspecified: Secondary | ICD-10-CM | POA: Diagnosis present

## 2023-07-26 DIAGNOSIS — Z7985 Long-term (current) use of injectable non-insulin antidiabetic drugs: Secondary | ICD-10-CM | POA: Insufficient documentation

## 2023-07-26 DIAGNOSIS — G039 Meningitis, unspecified: Secondary | ICD-10-CM | POA: Diagnosis present

## 2023-07-26 DIAGNOSIS — N9089 Other specified noninflammatory disorders of vulva and perineum: Secondary | ICD-10-CM | POA: Diagnosis not present

## 2023-07-26 DIAGNOSIS — E119 Type 2 diabetes mellitus without complications: Secondary | ICD-10-CM | POA: Insufficient documentation

## 2023-07-26 DIAGNOSIS — Z87891 Personal history of nicotine dependence: Secondary | ICD-10-CM | POA: Insufficient documentation

## 2023-07-26 DIAGNOSIS — Z7984 Long term (current) use of oral hypoglycemic drugs: Secondary | ICD-10-CM | POA: Diagnosis not present

## 2023-07-26 DIAGNOSIS — D72829 Elevated white blood cell count, unspecified: Secondary | ICD-10-CM | POA: Diagnosis not present

## 2023-07-26 DIAGNOSIS — Z87898 Personal history of other specified conditions: Secondary | ICD-10-CM

## 2023-07-26 HISTORY — DX: Unspecified asthma, uncomplicated: J45.909

## 2023-07-26 HISTORY — DX: Type 2 diabetes mellitus without complications: E11.9

## 2023-07-26 LAB — RESP PANEL BY RT-PCR (RSV, FLU A&B, COVID)  RVPGX2
Influenza A by PCR: NEGATIVE
Influenza B by PCR: NEGATIVE
Resp Syncytial Virus by PCR: NEGATIVE
SARS Coronavirus 2 by RT PCR: NEGATIVE

## 2023-07-26 MED ORDER — KETOROLAC TROMETHAMINE 15 MG/ML IJ SOLN
15.0000 mg | Freq: Once | INTRAMUSCULAR | Status: AC
  Administered 2023-07-26: 15 mg via INTRAMUSCULAR
  Filled 2023-07-26: qty 1

## 2023-07-26 MED ORDER — ACETAMINOPHEN 325 MG PO TABS
650.0000 mg | ORAL_TABLET | Freq: Once | ORAL | Status: AC
  Administered 2023-07-26: 650 mg via ORAL
  Filled 2023-07-26: qty 2

## 2023-07-26 NOTE — ED Triage Notes (Signed)
Pt complains of headaches x 2 days that has not gone away. Pt reports not taking any medication for the pain. Pt has light sensitivity.

## 2023-07-27 ENCOUNTER — Observation Stay (HOSPITAL_COMMUNITY): Payer: 59

## 2023-07-27 DIAGNOSIS — G039 Meningitis, unspecified: Secondary | ICD-10-CM | POA: Diagnosis not present

## 2023-07-27 DIAGNOSIS — G030 Nonpyogenic meningitis: Secondary | ICD-10-CM | POA: Diagnosis present

## 2023-07-27 LAB — PROTEIN, CSF: Total  Protein, CSF: 46 mg/dL — ABNORMAL HIGH (ref 15–45)

## 2023-07-27 LAB — RESPIRATORY PANEL BY PCR

## 2023-07-27 LAB — HIV ANTIBODY (ROUTINE TESTING W REFLEX): HIV Screen 4th Generation wRfx: NONREACTIVE

## 2023-07-27 LAB — MENINGITIS/ENCEPHALITIS PANEL (CSF)

## 2023-07-27 LAB — CBC WITH DIFFERENTIAL/PLATELET
Abs Immature Granulocytes: 0.08 10*3/uL — ABNORMAL HIGH (ref 0.00–0.07)
Basophils Absolute: 0.1 10*3/uL (ref 0.0–0.1)
Basophils Relative: 0 %
Eosinophils Absolute: 0 10*3/uL (ref 0.0–0.5)
Eosinophils Relative: 0 %
HCT: 38.8 % (ref 36.0–46.0)
Hemoglobin: 11.7 g/dL — ABNORMAL LOW (ref 12.0–15.0)
Immature Granulocytes: 1 %
Lymphocytes Relative: 27 %
Lymphs Abs: 4.6 10*3/uL — ABNORMAL HIGH (ref 0.7–4.0)
MCH: 24.2 pg — ABNORMAL LOW (ref 26.0–34.0)
MCHC: 30.2 g/dL (ref 30.0–36.0)
MCV: 80.2 fL (ref 80.0–100.0)
Monocytes Absolute: 1.1 10*3/uL — ABNORMAL HIGH (ref 0.1–1.0)
Monocytes Relative: 6 %
Neutro Abs: 11.4 10*3/uL — ABNORMAL HIGH (ref 1.7–7.7)
Neutrophils Relative %: 66 %
Platelets: 436 10*3/uL — ABNORMAL HIGH (ref 150–400)
RBC: 4.84 MIL/uL (ref 3.87–5.11)
RDW: 16.7 % — ABNORMAL HIGH (ref 11.5–15.5)
WBC: 17.3 10*3/uL — ABNORMAL HIGH (ref 4.0–10.5)
nRBC: 0 % (ref 0.0–0.2)

## 2023-07-27 LAB — BASIC METABOLIC PANEL
Anion gap: 7 (ref 5–15)
BUN: 10 mg/dL (ref 6–20)
CO2: 25 mmol/L (ref 22–32)
Calcium: 9.9 mg/dL (ref 8.9–10.3)
Chloride: 105 mmol/L (ref 98–111)
Creatinine, Ser: 0.88 mg/dL (ref 0.44–1.00)
GFR, Estimated: >60 mL/min (ref >60)
Glucose, Bld: 101 mg/dL — ABNORMAL HIGH (ref 70–99)
Potassium: 4 mmol/L (ref 3.5–5.1)
Sodium: 137 mmol/L (ref 135–145)

## 2023-07-27 LAB — CBG MONITORING, ED: Glucose-Capillary: 113 mg/dL — ABNORMAL HIGH (ref 70–99)

## 2023-07-27 LAB — URINALYSIS, ROUTINE W REFLEX MICROSCOPIC
Bacteria, UA: NONE SEEN
Bilirubin Urine: NEGATIVE
Glucose, UA: NEGATIVE mg/dL
Ketones, ur: NEGATIVE mg/dL
Nitrite: NEGATIVE
Protein, ur: 100 mg/dL — AB
RBC / HPF: >50 RBC/hpf (ref 0–5)
Specific Gravity, Urine: 1.036 — ABNORMAL HIGH (ref 1.005–1.030)
pH: 5 (ref 5.0–8.0)

## 2023-07-27 LAB — CSF CELL COUNT WITH DIFFERENTIAL
Eosinophils, CSF: 1 % (ref 0–1)
Lymphs, CSF: 79 % (ref 40–80)
Monocyte-Macrophage-Spinal Fluid: 19 % (ref 15–45)
RBC Count, CSF: 53 /mm3 — ABNORMAL HIGH
Segmented Neutrophils-CSF: 1 % (ref 0–6)
Tube #: 1
WBC, CSF: 130 /mm3 (ref 0–5)

## 2023-07-27 LAB — GLUCOSE, CAPILLARY
Glucose-Capillary: 126 mg/dL — ABNORMAL HIGH (ref 70–99)
Glucose-Capillary: 169 mg/dL — ABNORMAL HIGH (ref 70–99)

## 2023-07-27 LAB — GRAM STAIN

## 2023-07-27 LAB — CRYPTOCOCCAL ANTIGEN, CSF: Crypto Ag: NEGATIVE

## 2023-07-27 LAB — HCG, SERUM, QUALITATIVE: Preg, Serum: NEGATIVE

## 2023-07-27 LAB — HEMOGLOBIN A1C
Hgb A1c MFr Bld: 5.8 % — ABNORMAL HIGH (ref 4.8–5.6)
Mean Plasma Glucose: 119.76 mg/dL

## 2023-07-27 LAB — GLUCOSE, CSF: Glucose, CSF: 67 mg/dL (ref 40–70)

## 2023-07-27 LAB — MAGNESIUM: Magnesium: 2 mg/dL (ref 1.7–2.4)

## 2023-07-27 MED ORDER — NICOTINE 21 MG/24HR TD PT24
21.0000 mg | MEDICATED_PATCH | Freq: Every day | TRANSDERMAL | Status: DC
  Administered 2023-07-27 – 2023-07-28 (×2): 21 mg via TRANSDERMAL
  Filled 2023-07-27 (×2): qty 1

## 2023-07-27 MED ORDER — IOHEXOL 350 MG/ML SOLN
100.0000 mL | Freq: Once | INTRAVENOUS | Status: AC | PRN
  Administered 2023-07-27: 100 mL via INTRAVENOUS

## 2023-07-27 MED ORDER — DEXTROSE 5 % IV SOLN
10.0000 mg/kg | Freq: Three times a day (TID) | INTRAVENOUS | Status: DC
  Administered 2023-07-27 – 2023-07-28 (×4): 860 mg via INTRAVENOUS
  Filled 2023-07-27 (×6): qty 17.2

## 2023-07-27 MED ORDER — VANCOMYCIN HCL 2000 MG/400ML IV SOLN
2000.0000 mg | Freq: Once | INTRAVENOUS | Status: AC
  Administered 2023-07-27: 2000 mg via INTRAVENOUS
  Filled 2023-07-27: qty 400

## 2023-07-27 MED ORDER — SODIUM CHLORIDE 0.9 % IV SOLN
2.0000 g | Freq: Once | INTRAVENOUS | Status: AC
  Administered 2023-07-27: 2 g via INTRAVENOUS
  Filled 2023-07-27: qty 20

## 2023-07-27 MED ORDER — INSULIN ASPART 100 UNIT/ML IJ SOLN
0.0000 [IU] | Freq: Every day | INTRAMUSCULAR | Status: DC
  Filled 2023-07-27: qty 0.05

## 2023-07-27 MED ORDER — SODIUM CHLORIDE 0.9 % IV SOLN
2.0000 g | Freq: Two times a day (BID) | INTRAVENOUS | Status: DC
  Administered 2023-07-27 – 2023-07-28 (×3): 2 g via INTRAVENOUS
  Filled 2023-07-27 (×3): qty 20

## 2023-07-27 MED ORDER — LITHIUM CARBONATE 300 MG PO CAPS
300.0000 mg | ORAL_CAPSULE | Freq: Three times a day (TID) | ORAL | Status: DC
  Administered 2023-07-27 – 2023-07-28 (×5): 300 mg via ORAL
  Filled 2023-07-27 (×6): qty 1

## 2023-07-27 MED ORDER — VANCOMYCIN HCL IN DEXTROSE 1-5 GM/200ML-% IV SOLN: 1000.0000 mg | Freq: Once | INTRAVENOUS | Status: DC

## 2023-07-27 MED ORDER — ONDANSETRON HCL 4 MG/2ML IJ SOLN
4.0000 mg | Freq: Four times a day (QID) | INTRAMUSCULAR | Status: DC | PRN
  Administered 2023-07-27: 4 mg via INTRAVENOUS
  Filled 2023-07-27: qty 2

## 2023-07-27 MED ORDER — HYDROMORPHONE HCL 2 MG/ML IJ SOLN
2.0000 mg | Freq: Once | INTRAMUSCULAR | Status: AC
  Administered 2023-07-27: 2 mg via INTRAVENOUS
  Filled 2023-07-27: qty 1

## 2023-07-27 MED ORDER — ARIPIPRAZOLE 10 MG PO TABS
5.0000 mg | ORAL_TABLET | Freq: Every day | ORAL | Status: DC
Start: 2023-07-27 — End: 2023-07-28
  Administered 2023-07-27 – 2023-07-28 (×2): 5 mg via ORAL
  Filled 2023-07-27 (×2): qty 1

## 2023-07-27 MED ORDER — ONDANSETRON HCL 4 MG PO TABS
4.0000 mg | ORAL_TABLET | Freq: Four times a day (QID) | ORAL | Status: DC | PRN
Start: 2023-07-27 — End: 2023-07-28

## 2023-07-27 MED ORDER — LACTATED RINGERS IV SOLN: INTRAVENOUS | Status: DC

## 2023-07-27 MED ORDER — INSULIN ASPART 100 UNIT/ML IJ SOLN
0.0000 [IU] | Freq: Three times a day (TID) | INTRAMUSCULAR | Status: DC
  Administered 2023-07-27: 3 [IU] via SUBCUTANEOUS
  Administered 2023-07-27 – 2023-07-28 (×3): 2 [IU] via SUBCUTANEOUS
  Filled 2023-07-27: qty 0.15

## 2023-07-27 MED ORDER — METHADONE HCL 5 MG PO TABS
85.0000 mg | ORAL_TABLET | Freq: Every day | ORAL | Status: DC
  Administered 2023-07-27 – 2023-07-28 (×2): 85 mg via ORAL
  Filled 2023-07-27: qty 1
  Filled 2023-07-27: qty 9

## 2023-07-27 MED ORDER — ACETAMINOPHEN 325 MG PO TABS
650.0000 mg | ORAL_TABLET | Freq: Four times a day (QID) | ORAL | Status: DC | PRN
  Administered 2023-07-27 – 2023-07-28 (×3): 650 mg via ORAL
  Filled 2023-07-27 (×3): qty 2

## 2023-07-27 MED ORDER — GABAPENTIN 300 MG PO CAPS
300.0000 mg | ORAL_CAPSULE | Freq: Two times a day (BID) | ORAL | Status: DC | PRN
  Administered 2023-07-27 – 2023-07-28 (×3): 300 mg via ORAL
  Filled 2023-07-27 (×3): qty 1

## 2023-07-27 MED ORDER — ALBUTEROL SULFATE (2.5 MG/3ML) 0.083% IN NEBU: 2.5000 mg | INHALATION_SOLUTION | RESPIRATORY_TRACT | Status: DC | PRN

## 2023-07-27 MED ORDER — HYDROMORPHONE HCL 1 MG/ML IJ SOLN
1.0000 mg | Freq: Once | INTRAMUSCULAR | Status: AC
  Administered 2023-07-27: 1 mg via INTRAVENOUS
  Filled 2023-07-27: qty 1

## 2023-07-27 MED ORDER — LORAZEPAM 1 MG PO TABS
2.0000 mg | ORAL_TABLET | Freq: Once | ORAL | Status: AC
  Administered 2023-07-27: 2 mg via ORAL
  Filled 2023-07-27: qty 2

## 2023-07-27 MED ORDER — ESCITALOPRAM OXALATE 10 MG PO TABS: 20.0000 mg | ORAL_TABLET | Freq: Every day | ORAL | Status: DC

## 2023-07-27 MED ORDER — QUETIAPINE FUMARATE 50 MG PO TABS
50.0000 mg | ORAL_TABLET | Freq: Every evening | ORAL | Status: DC | PRN
  Administered 2023-07-27: 50 mg via ORAL
  Filled 2023-07-27: qty 1

## 2023-07-27 MED ORDER — ACETAMINOPHEN 650 MG RE SUPP: 650.0000 mg | Freq: Four times a day (QID) | RECTAL | Status: DC | PRN

## 2023-07-27 MED ORDER — VANCOMYCIN HCL 1250 MG/250ML IV SOLN
1250.0000 mg | Freq: Three times a day (TID) | INTRAVENOUS | Status: DC
  Administered 2023-07-27 – 2023-07-28 (×3): 1250 mg via INTRAVENOUS
  Filled 2023-07-27 (×5): qty 250

## 2023-07-27 MED ORDER — CITALOPRAM HYDROBROMIDE 10 MG PO TABS: 20.0000 mg | ORAL_TABLET | Freq: Every day | ORAL | Status: DC

## 2023-07-27 MED ORDER — DEXAMETHASONE SODIUM PHOSPHATE 10 MG/ML IJ SOLN
10.0000 mg | Freq: Four times a day (QID) | INTRAMUSCULAR | Status: DC
  Administered 2023-07-27 – 2023-07-28 (×6): 10 mg via INTRAVENOUS
  Filled 2023-07-27 (×7): qty 1

## 2023-07-27 MED ORDER — METOCLOPRAMIDE HCL 5 MG/ML IJ SOLN
10.0000 mg | Freq: Once | INTRAMUSCULAR | Status: AC
  Administered 2023-07-27: 10 mg via INTRAVENOUS
  Filled 2023-07-27: qty 2

## 2023-07-27 NOTE — ED Provider Notes (Signed)
[place] EMERGENCY DEPARTMENT AT [facility] Provider Note   CSN: [id number] Arrival date & time: 07/26/23  2055     History  Chief Complaint  Patient presents with   Headache    Melissa Castillo is a 33 y.o. female.  Patient presents for the second time in 2 days complaining of a severe headache.  Headache reportedly began at approximately 10 PM on February 12.  The headache was throbbing, behind the eyes.  The patient took Tylenol at home without relief.  She does endorse starting nicotine patches to help with smoking cessation over the past few days and has been using them as directed.  She goes to a methadone clinic routinely.  The patient was treated at the emergency department on the morning of February 13 with a headache cocktail with moderate improvement in symptoms.  She had a negative CT scan during that visit.  She did not have meningeal signs during that visit.  Today she endorses photophobia, complains of pain with movement of the neck.  She states the pain is 10 out of 10 in severity.   Headache      Home Medications Prior to Admission medications   Medication Sig Start Date End Date Taking? Authorizing Provider  albuterol (VENTOLIN HFA) 108 (90 Base) MCG/ACT inhaler Inhale 1-2 puffs into the lungs every 6 (six) hours as needed for wheezing or shortness of breath. 09/28/20   Sponseller, Lupe Carney R, PA-C  escitalopram (LEXAPRO) 20 MG tablet Take 20 mg by mouth daily. 09/21/20   [provider]  gabapentin (NEURONTIN) 300 MG capsule Take 300 mg by mouth 2 (two) times daily as needed (pain). 09/21/20   [provider]  metFORMIN (GLUCOPHAGE) 500 MG tablet Take 500 mg by mouth daily. 09/13/20   [provider]  methadone (DOLOPHINE) 10 MG/5ML solution Take 115 mg by mouth daily. Per [facility] LPN verified @ [place] [phone] had one take home dose left for today    [provider]  QUEtiapine (SEROQUEL) 50 MG tablet Take 50 mg by  mouth at bedtime as needed (sleep). 07/13/20   [provider]      Allergies    Patient has no known allergies.    Review of Systems   Review of Systems  Neurological:  Positive for headaches.    Physical Exam Updated Vital Signs BP (!) 147/75   Pulse 74   Temp 99 F (37.2 C) (Oral)   Resp 20   LMP 07/10/2023   SpO2 100%  Physical Exam Vitals and nursing note reviewed.  Constitutional:      Appearance: She is well-developed. She is obese.  HENT:     Head: Normocephalic and atraumatic.  Eyes:     Conjunctiva/sclera: Conjunctivae normal.  Neck:     Comments: Headache worsens with neck flexion Cardiovascular:     Rate and Rhythm: Normal rate and regular rhythm.  Pulmonary:     Effort: Pulmonary effort is normal. No respiratory distress.     Breath sounds: Normal breath sounds.  Abdominal:     Palpations: Abdomen is soft.     Tenderness: There is no abdominal tenderness.  Musculoskeletal:        General: No swelling.     Cervical back: Neck supple.  Skin:    General: Skin is warm and dry.     Capillary Refill: Capillary refill takes less than 2 seconds.  Neurological:     Mental Status: She is alert.  Psychiatric:  Mood and Affect: Mood normal.     ED Results / Procedures / Treatments   Labs (all labs ordered are listed, but only abnormal results are displayed) Labs Reviewed  BASIC METABOLIC PANEL - Abnormal; Notable for the following components:      Result Value   Glucose, Bld 101 (*)    All other components within normal limits  CBC WITH DIFFERENTIAL/PLATELET - Abnormal; Notable for the following components:   WBC 17.3 (*)    Hemoglobin 11.7 (*)    MCH 24.2 (*)    RDW 16.7 (*)    Platelets 436 (*)    Neutro Abs 11.4 (*)    Lymphs Abs 4.6 (*)    Monocytes Absolute 1.1 (*)    Abs Immature Granulocytes 0.08 (*)    All other components within normal limits  RESP PANEL BY RT-PCR (RSV, FLU A&B, COVID)  RVPGX2  CSF CULTURE W GRAM STAIN   HCG, SERUM, QUALITATIVE  CSF CELL COUNT WITH DIFFERENTIAL  PROTEIN AND GLUCOSE, CSF  CSF CELL COUNT WITH DIFFERENTIAL  URINALYSIS, ROUTINE W REFLEX MICROSCOPIC    EKG None  Radiology CT Head Wo Contrast Result Date: 07/25/2023 CLINICAL DATA:  Headache, sudden and severe. Headache behind the eyes EXAM: CT HEAD WITHOUT CONTRAST TECHNIQUE: Contiguous axial images were obtained from the base of the skull through the vertex without intravenous contrast. RADIATION DOSE REDUCTION: This exam was performed according to the departmental dose-optimization program which includes automated exposure control, adjustment of the mA and/or kV according to patient size and/or use of iterative reconstruction technique. COMPARISON:  None Available. FINDINGS: Brain: No evidence of acute infarction, hemorrhage, hydrocephalus, extra-axial collection or mass effect. 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 Vascular: No hyperdense vessel or unexpected calcification. Skull: Normal. Negative for fracture or focal lesion. Sinuses/Orbits: No acute finding.  Clear paranasal sinuses IMPRESSION: No acute finding or specific cause for headache. Electronically Signed   By: Tiburcio Pea M.D.   On: 07/25/2023 05:08    Procedures .Critical Care  Performed by: Darrick Grinder, PA-C Authorized by: Darrick Grinder, PA-C   Critical care provider statement:    Critical care time (minutes):  30   Critical care time was exclusive of:  Separately billable procedures and treating other patients   Critical care was time spent personally by me on the following activities:  Development of treatment plan with patient or surrogate, discussions with consultants, evaluation of patient's response to treatment, examination of patient, ordering and review of laboratory studies, ordering and review of radiographic studies, ordering and performing treatments and  interventions, pulse oximetry, re-evaluation of patient's condition and review of old charts   Care discussed with: admitting provider       Medications Ordered in ED Medications  vancomycin (VANCOREADY) IVPB 2000 mg/400 mL (2,000 mg Intravenous New Bag/Given 07/27/23 0423)  acetaminophen (TYLENOL) tablet 650 mg (650 mg Oral Given 07/26/23 2122)  ketorolac (TORADOL) 15 MG/ML injection 15 mg (15 mg Intramuscular Given 07/26/23 2221)  metoCLOPramide (REGLAN) injection 10 mg (10 mg Intravenous Given 07/27/23 0136)  cefTRIAXone (ROCEPHIN) 2 g in sodium chloride 0.9 % 100 mL IVPB (0 g Intravenous Stopped 07/27/23 0340)  HYDROmorphone (DILAUDID) injection 1 mg (1 mg Intravenous Given 07/27/23 0306)    ED Course/ Medical Decision Making/ A&P  Medical Decision Making Amount and/or Complexity of Data Reviewed Labs: ordered.  Risk OTC drugs. Prescription drug management. Decision regarding hospitalization.   This patient presents to the ED for concern of headache, this involves an extensive number of treatment options, and is a complaint that carries with it a high risk of complications and morbidity.  The differential diagnosis includes tension headache, cluster headache, migraine, intracranial abnormality, meningitis, others   Co morbidities that complicate the patient evaluation  Patient goes to methadone clinic   Additional history obtained:  Additional history obtained from EMS External records from outside source obtained and reviewed including CT scan from 2 days ago showing no acute abnormality   Lab Tests:  I Ordered, and personally interpreted labs.  The pertinent results include: Leukocytosis with a white count of 17,300, negative pregnancy test   Consultations Obtained:  I requested consultation with the hospitalist, Dr. Lazarus Salines, and discussed lab and imaging findings as well as pertinent plan - they recommend: admission   Problem List /  ED Course / Critical interventions / Medication management   I ordered medication including Toradol, Tylenol, Compazine, Dilaudid for headache; vancomycin and Rocephin for meningitis coverage Reevaluation of the patient after these medicines showed that the patient stayed the same I have reviewed the patients home medicines and have made adjustments as needed   Test / Admission - Considered:  Dr. Bebe Shaggy attempted LP at bedside.  Unfortunately attempts were unsuccessful and patient requested to stop procedure.  Patient continues to have severe headache.  This headache has now been ongoing for approximately 3 days. Pain is difficult to control. Concern for possible meningitis. IV antibiotics initiated. Patient will need admission for further workup, possible IR LP.          Final Clinical Impression(s) / ED Diagnoses Final diagnoses:  Severe headache  Leukocytosis, unspecified type    Rx / DC Orders ED Discharge Orders     None         Pamala Duffel 07/27/23 0447    Zadie Rhine, MD 07/27/23 [phone]

## 2023-07-27 NOTE — ED Notes (Signed)
Neuro NP at BS 

## 2023-07-27 NOTE — ED Notes (Signed)
Back from CT, IVF hung, ativan given, IR LP pending around 1030.

## 2023-07-27 NOTE — ED Notes (Signed)
Pt up out of bed, urinated in floor and threw menstrual pad in floor. NT to room to assist pt with care, pt told NT not to turn on light. Pt provided with linen and supplies for personal hygiene care. Pt back in bed and Room cleaned.

## 2023-07-27 NOTE — Plan of Care (Signed)
  Problem: Education: Goal: Knowledge of General Education information will improve Description: Including pain rating scale, medication(s)/side effects and non-pharmacologic comfort measures Outcome: Progressing   Problem: Clinical Measurements: Goal: Ability to maintain clinical measurements within normal limits will improve Outcome: Progressing Goal: Will remain free from infection Outcome: Progressing Goal: Diagnostic test results will improve Outcome: Progressing   Problem: Activity: Goal: Risk for activity intolerance will decrease Outcome: Progressing   

## 2023-07-27 NOTE — Procedures (Signed)
PROCEDURE SUMMARY:  Successful fluoroscopic guided lumbar puncture.  Opening pressure was 39 cm H2O ~13 mL clear colorless cerebrospinal fluid collected and sent for labs.  Closing pressure was 21 cm H2O  No immediate complications.  Pt tolerated well.   EBL = none  Please see full dictation in imaging section of Epic for procedure details.  Electronically signed by: Gershon Crane, PA-C 06/07/2021 9:41 AM

## 2023-07-27 NOTE — Consult Note (Signed)
NEUROLOGY CONSULT NOTE   Date of service: July 27, 2023 Patient Name: Melissa Castillo MRN:  [id number] DOB:  April 07, 1991 Chief Complaint: "headache" Requesting Provider: Maryln Gottron, MD  History of Present Illness  Carmelina Balducci is a 33 y.o. female with past medical history significant for diabetes, depression, morbid obesity, methadone treatment (daily at clinic) who presented to [facility], ED 2/14 due to severe headache x 3 days with photophobia.  She was treated in the ED and discharged home yesterday. Patient has been started on empiric antibiotics after unsuccessful LP was done in the ED last night, plan for flouro LP today. Neurology consulted due to headache and concern for meningitis. Case has been discussed with attending.   ROS  Comprehensive ROS performed and pertinent positives documented in HPI   Past History   Past Medical History:  Diagnosis Date   Asthma    Diabetes (HCC)     History reviewed. No pertinent surgical history.  Family History: History reviewed. No pertinent family history.  Social History  reports that she has quit smoking. Her smoking use included cigarettes. She has never used smokeless tobacco. No history on file for alcohol use and drug use.  No Known Allergies  Medications   Current Facility-Administered Medications:    acetaminophen (TYLENOL) tablet 650 mg, 650 mg, Oral, Q6H PRN, 650 mg at 07/27/23 0916 **OR** acetaminophen (TYLENOL) suppository 650 mg, 650 mg, Rectal, Q6H PRN, Kirby Crigler, Mir M, MD   acyclovir (ZOVIRAX) 860 mg in dextrose 5 % 250 mL IVPB, 10 mg/kg (Adjusted), Intravenous, Q8H, Ellington, Abby K, RPH   albuterol (PROVENTIL) (2.5 MG/3ML) 0.083% nebulizer solution 2.5 mg, 2.5 mg, Nebulization, Q2H PRN, Kirby Crigler, Mir M, MD   ARIPiprazole (ABILIFY) tablet 5 mg, 5 mg, Oral, Daily, Kirby Crigler, Mir M, MD, 5 mg at 07/27/23 5284   cefTRIAXone (ROCEPHIN) 2 g in sodium chloride 0.9 % 100 mL IVPB, 2 g, Intravenous,  Q12H, Kirby Crigler, Mir M, MD, Last Rate: 200 mL/hr at 07/27/23 0914, 2 g at 07/27/23 0914   dexamethasone (DECADRON) injection 10 mg, 10 mg, Intravenous, Q6H, Kirby Crigler, Mir M, MD, 10 mg at 07/27/23 0902   gabapentin (NEURONTIN) capsule 300 mg, 300 mg, Oral, BID PRN, Kirby Crigler, Mir M, MD, 300 mg at 07/27/23 0916   insulin aspart (novoLOG) injection 0-15 Units, 0-15 Units, Subcutaneous, TID WC, Kirby Crigler, Mir M, MD   insulin aspart (novoLOG) injection 0-5 Units, 0-5 Units, Subcutaneous, QHS, Ikramullah, Mir M, MD   lactated ringers infusion, , Intravenous, Continuous, Ellington, Abby K, RPH   lithium carbonate capsule 300 mg, 300 mg, Oral, TID, Kirby Crigler, Mir M, MD   methadone (DOLOPHINE) tablet 85 mg, 85 mg, Oral, Daily, Kirby Crigler, Mir M, MD, 85 mg at 07/27/23 1324   nicotine (NICODERM CQ - dosed in mg/24 hours) patch 21 mg, 21 mg, Transdermal, Daily, Kirby Crigler, Mir M, MD   ondansetron (ZOFRAN) tablet 4 mg, 4 mg, Oral, Q6H PRN **OR** ondansetron (ZOFRAN) injection 4 mg, 4 mg, Intravenous, Q6H PRN, Kirby Crigler, Mir M, MD, 4 mg at 07/27/23 0902   QUEtiapine (SEROQUEL) tablet 50 mg, 50 mg, Oral, QHS PRN, Kirby Crigler, Mir M, MD   vancomycin (VANCOREADY) IVPB 1250 mg/250 mL, 1,250 mg, Intravenous, Q8H, Ellington, Abby K, RPH  Current Outpatient Medications:    lithium 300 MG tablet, Take 300 mg by mouth 3 (three) times daily., Disp: , Rfl:    albuterol (VENTOLIN HFA) 108 (90 Base) MCG/ACT inhaler, Inhale 1-2 puffs into the lungs every 6 (six) hours as needed for wheezing  or shortness of breath., Disp: 1 each, Rfl: 0   ARIPiprazole (ABILIFY) 5 MG tablet, Take 5 mg by mouth daily., Disp: , Rfl:    citalopram (CELEXA) 20 MG tablet, Take 20 mg by mouth daily., Disp: , Rfl:    escitalopram (LEXAPRO) 20 MG tablet, Take 20 mg by mouth daily., Disp: , Rfl:    gabapentin (NEURONTIN) 300 MG capsule, Take 300 mg by mouth 2 (two) times daily as needed (pain)., Disp: , Rfl:    metFORMIN (GLUCOPHAGE) 500 MG  tablet, Take 500 mg by mouth daily., Disp: , Rfl:    methadone (DOLOPHINE) 10 MG/5ML solution, Take 115 mg by mouth daily. Per [facility] LPN verified @ [place] [phone] had one take home dose left for today, Disp: , Rfl:    nicotine (NICODERM CQ - DOSED IN MG/24 HOURS) 21 mg/24hr patch, 21 mg daily., Disp: , Rfl:    nicotine polacrilex (NICORETTE) 4 MG gum, SMARTSIG:1 gum By Mouth Every 2 Hours PRN, Disp: , Rfl:    QUEtiapine (SEROQUEL) 50 MG tablet, Take 50 mg by mouth at bedtime as needed (sleep)., Disp: , Rfl:    TRULICITY 0.75 MG/0.5ML SOAJ, Inject 0.75 mg into the skin once a week., Disp: , Rfl:   Vitals   Vitals:   08-10-2023 0628 August 10, 2023 0629 08-10-2023 0630 08/10/2023 0631  BP:      Pulse: 73 72 77 97  Resp:      Temp:      TempSrc:      SpO2: 95% 96% 100% 100%    There is no height or weight on file to calculate BMI.  Physical Exam   Constitutional: Appears well-developed and well-nourished.  Psych: Restless and non-cooperative Eyes: No scleral injection.  HENT: No OP obstruction.  Head: Normocephalic.  Cardiovascular: Normal rate and regular rhythm.  Respiratory: Effort normal, non-labored breathing.  GI: Soft.  No distension. There is no tenderness.  Skin: WDI.   Neurologic Examination   Patient is awake and alert, oriented and able to give history. She is non cooperative with full exam. Denies vision loss. Tracks examiner and blinks to threat bilaterally.  No aphasia or dysarthria.  CN II-XII grossly intact. No focal weakness. No overt ataxia seen.  Labs/Imaging/Neurodiagnostic studies   CBC:  Recent Labs  Lab 08-10-23 0109  WBC 17.3*  NEUTROABS 11.4*  HGB 11.7*  HCT 38.8  MCV 80.2  PLT 436*   Basic Metabolic Panel:  Lab Results  Component Value Date   NA 137 Aug 10, 2023   K 4.0 10-Aug-2023   CO2 25 August 10, 2023   GLUCOSE 101 (H) 08-10-2023   BUN 10 08/10/23   CREATININE 0.88 2023-08-10   CALCIUM 9.9 10-Aug-2023   GFRNONAA >60 August 10, 2023    Lipid Panel: No results found for: "LDLCALC" HgbA1c: No results found for: "HGBA1C" Urine Drug Screen: No results found for: "LABOPIA", "COCAINSCRNUR", "LABBENZ", "AMPHETMU", "THCU", "LABBARB"  Alcohol Level No results found for: "ETH" INR No results found for: "INR" APTT No results found for: "APTT" AED levels: No results found for: "PHENYTOIN", "ZONISAMIDE", "LAMOTRIGINE", "LEVETIRACETA"  CT Head without contrast(Personally reviewed): No acute finding or specific cause for headache.   CT angio Head and Neck with contrast(Personally reviewed): PENDING  CT Venogram: PENDING  ASSESSMENT   Dessirae Streng is a 33 y.o. female with past medical history significant for diabetes, depression, morbid obesity, methadone treatment who presented 2/14 with severe headache x 3 days who now complains of continued photophobia nuchal rigidity with concern for possible meningitis.  She has been started on empiric antibiotics after unsuccessful LP, plan for LP under IR today. Patient not cooperative with full neurological exam.  She does deny history of migraines, nausea, pain with eye movement, vision loss at this time.  She has no confusion or focal weakness.  She does endorse severe headache that she states is getting worse because she has not received her methadone this morning.  Her neck does not seem rigid with her movement when distracted but she does states severe pain when her neck is assessed specifically.  Broad Differentials at this time include but not limited to: [facility] versus DVST versus Complicated Migraine versus Cervicogenic Headache versus Meningitis.  RECOMMENDATIONS   - Agree with LP under flouro  Opening pressure requested  If pressure high and/or WBC high, add M/E panel - CT Venogram to assess for dural venous sinus thrombosis - CT angio head and neck to rule out subarachnoid hemorrhage - Continue empiric IV Rocephin, IV vancomycin, IV acyclovir - Continue IV Decadron -  Monitor replace electrolytes as you are  Will check Magnesium - Avoid dehydration ______________________________________________________________________    Pt seen by Neuro NP/APP and later by MD. Note/plan to be edited by MD as needed.    Lynnae January, DNP, AGACNP-BC [facility] Please use AMION for contact information & EPIC for messaging.  NEUROHOSPITALIST ADDENDUM Performed a face to face diagnostic evaluation.   I have reviewed the contents of history and physical exam as documented by PA/ARNP/Resident and agree with above documentation.  I have discussed and formulated the above plan as documented. Edits to the note have been made as needed.  Erick Blinks, MD [facility] [phone]   If 7pm to 7am, please call on call as listed on AMION.

## 2023-07-27 NOTE — ED Notes (Signed)
Admitting at BS

## 2023-07-27 NOTE — Progress Notes (Addendum)
Pharmacy Antibiotic Note  Melissa Castillo is a 33 y.o. female admitted on 07/26/2023 with concern for meningitis. Patient reports severe headache, nuchal rigidity on exam. LP attempted but unsuccessful. Pharmacy has been consulted for vancomycin and acyclovir dosing.  Plan: -Continue on ceftriaxone 2 g IV q12h -Start vancomycin 1250 mg IV q8h -Start acyclovir 860 mg (10 mg/kg adjBW) q8h with LR at 125 ml/hr to reduce risk of nephrotoxicity -Follow renal function closely given high risk for nephrotoxicity and adjust doses/frequency as necessary -Continue to follow cultures and clinical progress for de-escalation as indicated     Temp (24hrs), Avg:99.6 F (37.6 C), Min:98.2 F (36.8 C), Max:102.4 F (39.1 C)  Recent Labs  Lab 07/27/23 0109  WBC 17.3*  CREATININE 0.88    Estimated Creatinine Clearance: 122.4 mL/min (by C-G formula based on SCr of 0.88 mg/dL).    No Known Allergies  Antimicrobials this admission: Ceftriaxone 2/15 >> Vancomycin 2/15 >> Acyclovir 2/15 >>  Dose adjustments this admission: NA  Microbiology results: 2/15 CSF pending LP 2/14 Resp panel: pending  Thank you for allowing pharmacy to be a part of this patient's care.   Pricilla Riffle, PharmD, BCPS Clinical Pharmacist 07/27/2023 10:06 AM

## 2023-07-27 NOTE — ED Notes (Signed)
Up to [facility], urine sample obtained. Currently on menstrual cycle.

## 2023-07-27 NOTE — ED Notes (Signed)
Pt resting in the dark. C/o 10/10 HA. Denies nausea. Last methadone yesterday morning. Pending assessment/ admission by hospitalist. Failed LP attempt yesterday. ABT recently initiated/ given. Pending urine sample.

## 2023-07-27 NOTE — H&P (Signed)
History and Physical  Lacy Sofia ZOX:[id number] DOB: 06-Sep-1990 DOA: 07/26/2023  PCP: System, Provider Not In   Chief Complaint: Headache  HPI: Melissa Castillo is a 33 y.o. female with medical history significant for morbid obesity, depression, diabetes, ADHD being admitted to the hospital with severe headache with nuchal rigidity and concern for possible meningitis.  Patient started having severe headache about 3 days ago, with photophobia.  She was treated in the emergency department and discharged home states that she never really felt better.  She came back to the ER last night, she now has what appears to be nuchal rigidity, photophobia, had a fever.  Denies any confusion, no sick contacts, no cough, congestion or other complaints.  In the emergency department, she was started on empiric IV Rocephin and vancomycin after LP was unsuccessful.  Review of Systems: Please see HPI for pertinent positives and negatives. A complete 10 system review of systems are otherwise negative.  Past Medical History:  Diagnosis Date   Asthma    Diabetes (HCC)    History reviewed. No pertinent surgical history. Social History:  reports that she has quit smoking. Her smoking use included cigarettes. She has never used smokeless tobacco. No history on file for alcohol use and drug use.  No Known Allergies  History reviewed. No pertinent family history.   Prior to Admission medications   Medication Sig Start Date End Date Taking? Authorizing Provider  lithium 300 MG tablet Take 300 mg by mouth 3 (three) times daily. 07/02/23  Yes [provider]  albuterol (VENTOLIN HFA) 108 (90 Base) MCG/ACT inhaler Inhale 1-2 puffs into the lungs every 6 (six) hours as needed for wheezing or shortness of breath. 09/28/20   Sponseller, Lupe Carney R, PA-C  ARIPiprazole (ABILIFY) 5 MG tablet Take 5 mg by mouth daily. 06/28/23   [provider]  citalopram (CELEXA) 20 MG tablet Take 20 mg by mouth daily.     [provider]  escitalopram (LEXAPRO) 20 MG tablet Take 20 mg by mouth daily. 09/21/20   [provider]  gabapentin (NEURONTIN) 300 MG capsule Take 300 mg by mouth 2 (two) times daily as needed (pain). 09/21/20   [provider]  metFORMIN (GLUCOPHAGE) 500 MG tablet Take 500 mg by mouth daily. 09/13/20   [provider]  methadone (DOLOPHINE) 10 MG/5ML solution Take 115 mg by mouth daily. Per [facility] LPN verified @ [place] [phone] had one take home dose left for today    [provider]  nicotine (NICODERM CQ - DOSED IN MG/24 HOURS) 21 mg/24hr patch 21 mg daily.    [provider]  nicotine polacrilex (NICORETTE) 4 MG gum SMARTSIG:1 gum By Mouth Every 2 Hours PRN    [provider]  QUEtiapine (SEROQUEL) 50 MG tablet Take 50 mg by mouth at bedtime as needed (sleep). 07/13/20   [provider]  TRULICITY 0.75 MG/0.5ML SOAJ Inject 0.75 mg into the skin once a week.    [provider]    Physical Exam: BP 125/88   Pulse 97   Temp 99.6 F (37.6 C) (Oral)   Resp 20   LMP 07/10/2023   SpO2 100%  General:  Alert, oriented, calm, keeping her eyes closed, appears to be in some distress Cardiovascular: RRR, no murmurs or rubs, no peripheral edema  Respiratory: clear to auscultation bilaterally, no wheezes, no crackles  Abdomen: soft, nontender, nondistended, normal bowel tones heard  Skin: dry, no rashes  Musculoskeletal: no joint effusions, normal range  of motion  Psychiatric: appropriate affect, normal speech  Neurologic: extraocular muscles intact, clear speech, moving all extremities with intact sensorium         Labs on Admission:  Basic Metabolic Panel: Recent Labs  Lab 07/27/23 0109  NA 137  K 4.0  CL 105  CO2 25  GLUCOSE 101*  BUN 10  CREATININE 0.88  CALCIUM 9.9   Liver Function Tests: No results for input(s): "AST", "ALT", "ALKPHOS", "BILITOT", "PROT", "ALBUMIN" in the last 168  hours. No results for input(s): "LIPASE", "AMYLASE" in the last 168 hours. No results for input(s): "AMMONIA" in the last 168 hours. CBC: Recent Labs  Lab 07/27/23 0109  WBC 17.3*  NEUTROABS 11.4*  HGB 11.7*  HCT 38.8  MCV 80.2  PLT 436*   Cardiac Enzymes: No results for input(s): "CKTOTAL", "CKMB", "CKMBINDEX", "TROPONINI" in the last 168 hours. BNP (last 3 results) No results for input(s): "BNP" in the last 8760 hours.  ProBNP (last 3 results) No results for input(s): "PROBNP" in the last 8760 hours.  CBG: No results for input(s): "GLUCAP" in the last 168 hours.  Radiological Exams on Admission: Noncontrast head CT 07/25/2023 with no acute findings.  Assessment/Plan Rosmary Hegwood is a 33 y.o. female with medical history significant for morbid obesity, depression, diabetes, ADHD being admitted to the hospital with severe headache with nuchal rigidity and concern for possible meningitis.  Meningitis-suspected, possible bacterial versus viral.  Discussed over the phone with Dr. Derry Lory of neurology, they will see in consultation today. -Observation admission to MedSurg -Continue empiric IV Rocephin, IV vancomycin, IV acyclovir with LR infusion -IV Decadron -IR lumbar puncture ordered, with opening pressure requested  Severe headache-given IM Toradol overnight without much relief -Check CT angio head and neck to rule out subarachnoid hemorrhage -Check CT venogram to rule out dural venous sinus thrombosis  Type 2 diabetes-carb modified diet when eating, with moderate dose sliding scale  Narcotic dependency-continue home methadone 85 mg daily  ADHD-continue Abilify  DVT prophylaxis: SCDs    Code Status: Full Code  Consults called: Neurology Dr. Derry Lory  Admission status: Observation  Time spent: 55 minutes  Lolamae Voisin Sharlette Dense MD [facility] Pager [phone]  If 7PM-7AM, please contact night-coverage [email or web address] Password [email or web address]  07/27/2023,  8:40 AM

## 2023-07-27 NOTE — ED Provider Notes (Signed)
Lumbar Puncture  Date/Time: 07/27/2023 2:40 AM  Performed by: Zadie Rhine, MD Authorized by: Zadie Rhine, MD   Consent:    Consent obtained:  Written   Consent given by:  Patient   Risks, benefits, and alternatives were discussed: yes     Risks discussed:  Bleeding, headache, infection and pain   Alternatives discussed:  No treatment Universal protocol:    Procedure explained and questions answered to patient or proxy's satisfaction: yes     Patient identity confirmed:  Verbally with patient and provided demographic data Pre-procedure details:    Procedure purpose:  Diagnostic   Preparation: Patient was prepped and draped in usual sterile fashion   Anesthesia:    Anesthesia method:  Local infiltration   Local anesthetic:  Lidocaine 1% w/o epi Procedure details:    Lumbar space:  L3-L4 interspace   Patient position:  Sitting   Needle gauge:  20   Number of attempts:  2 Post-procedure details:    Procedure completion:  Procedure terminated at patient's request Comments:     Multiple unsuccessful attempts at LP, overall patient tolerated well but requested to stop procedure     Zadie Rhine, MD 07/27/23 [phone]

## 2023-07-27 NOTE — ED Notes (Signed)
To CT at this time, preparing for transport to floor.

## 2023-07-28 ENCOUNTER — Observation Stay (HOSPITAL_COMMUNITY): Payer: 59

## 2023-07-28 DIAGNOSIS — D72829 Elevated white blood cell count, unspecified: Secondary | ICD-10-CM

## 2023-07-28 DIAGNOSIS — R519 Headache, unspecified: Principal | ICD-10-CM

## 2023-07-28 DIAGNOSIS — Z79891 Long term (current) use of opiate analgesic: Secondary | ICD-10-CM | POA: Diagnosis not present

## 2023-07-28 DIAGNOSIS — G030 Nonpyogenic meningitis: Principal | ICD-10-CM

## 2023-07-28 DIAGNOSIS — Z87898 Personal history of other specified conditions: Secondary | ICD-10-CM | POA: Diagnosis not present

## 2023-07-28 DIAGNOSIS — N949 Unspecified condition associated with female genital organs and menstrual cycle: Secondary | ICD-10-CM

## 2023-07-28 LAB — GLUCOSE, CAPILLARY
Glucose-Capillary: 160 mg/dL — ABNORMAL HIGH (ref 70–99)
Glucose-Capillary: 147 mg/dL — ABNORMAL HIGH (ref 70–99)
Glucose-Capillary: 147 mg/dL — ABNORMAL HIGH (ref 70–99)

## 2023-07-28 LAB — HEPATITIS B SURFACE ANTIGEN: Hepatitis B Surface Ag: NONREACTIVE

## 2023-07-28 LAB — RPR: RPR Ser Ql: NONREACTIVE

## 2023-07-28 MED ORDER — VALACYCLOVIR HCL 1 G PO TABS: 1000.0000 mg | ORAL_TABLET | Freq: Three times a day (TID) | ORAL | 0 refills | Status: AC

## 2023-07-28 MED ORDER — BUTALBITAL-APAP-CAFFEINE 50-325-40 MG PO TABS
1.0000 | ORAL_TABLET | Freq: Once | ORAL | Status: AC | PRN
  Administered 2023-07-28: 1 via ORAL
  Filled 2023-07-28: qty 1

## 2023-07-28 MED ORDER — METHADONE HCL 5 MG PO TABS: 185.0000 mg | ORAL_TABLET | Freq: Every day | ORAL | Status: DC

## 2023-07-28 MED ORDER — ONDANSETRON HCL 4 MG PO TABS: 4.0000 mg | ORAL_TABLET | Freq: Four times a day (QID) | ORAL | 0 refills | Status: AC | PRN

## 2023-07-28 MED ORDER — VALACYCLOVIR HCL 500 MG PO TABS
1000.0000 mg | ORAL_TABLET | Freq: Three times a day (TID) | ORAL | Status: DC
  Administered 2023-07-28: 1000 mg via ORAL
  Filled 2023-07-28 (×2): qty 2

## 2023-07-28 MED ORDER — GADOBUTROL 1 MMOL/ML IV SOLN
10.0000 mL | Freq: Once | INTRAVENOUS | Status: AC | PRN
  Administered 2023-07-28: 10 mL via INTRAVENOUS

## 2023-07-28 MED ORDER — METHADONE HCL 10 MG PO TABS
100.0000 mg | ORAL_TABLET | Freq: Once | ORAL | Status: AC
  Administered 2023-07-28: 100 mg via ORAL
  Filled 2023-07-28: qty 10

## 2023-07-28 MED ORDER — LORAZEPAM 2 MG/ML IJ SOLN: 1.0000 mg | INTRAMUSCULAR | Status: DC | PRN

## 2023-07-28 NOTE — Progress Notes (Signed)
PROGRESS NOTE    Melissa Castillo  EXB:[id number] DOB: [birth year] DOA: 07/26/2023 PCP: System, Provider Not In  Subjective: Pt seen and examined. Pt's partner at bedside Kisha. Discussed with ID. Felt that pt's aseptic meningitis could be HVS even if meningitis/encephalitis panel negative. Pt with concurrent genital lesion and hx of cold sores.  MRI brain today negative for acute CVA.  Pt wanted to go home today.   Hospital Course: HPI: Melissa Castillo is a 33 y.o. female with medical history significant for morbid obesity, depression, diabetes, ADHD being admitted to the hospital with severe headache with nuchal rigidity and concern for possible meningitis.  Patient started having severe headache about 3 days ago, with photophobia.  She was treated in the emergency department and discharged home states that she never really felt better.  She came back to the ER last night, she now has what appears to be nuchal rigidity, photophobia, had a fever.  Denies any confusion, no sick contacts, no cough, congestion or other complaints.  In the emergency department, she was started on empiric IV Rocephin and vancomycin after LP was unsuccessful.   Significant Events: Admitted 07/26/2023 for suspected meningitis   Significant Labs: Na 137, K 4.0, BUN 10, Scr 0.88, glu 101 WBC 17.3, HgB 11.7, plt 436 CSF WBC 130, 79% lymph, 1% neutrophils, 19% macrophage Meningitis/encephalitis panel is negative HIV non-reactive  Significant Imaging Studies: CT head No acute finding or specific cause for headache  CT venogram head No evidence of dural venous sinus thrombosis. 2. Partially empty sella turcica. This finding can reflect incidental anatomic variation, or alternatively, it can be associated with chronic idiopathic intracranial  hypertension (pseudotumor cerebri). CT head angio No proximal intracranial large vessel occlusion or high-grade proximal arterial stenosis. 2. No intracranial aneurysm  identified  Antibiotic Therapy: Anti-infectives (From admission, onward)    Start     Dose/Rate Route Frequency Ordered Stop   07/27/23 1200  vancomycin (VANCOREADY) IVPB 1250 mg/250 mL        1,250 mg 166.7 mL/hr over 90 Minutes Intravenous Every 8 hours 07/27/23 0849     07/27/23 1000  cefTRIAXone (ROCEPHIN) 2 g in sodium chloride 0.9 % 100 mL IVPB        2 g 200 mL/hr over 30 Minutes Intravenous Every 12 hours 07/27/23 0827     07/27/23 1000  acyclovir (ZOVIRAX) 860 mg in dextrose 5 % 250 mL IVPB        10 mg/kg  86.1 kg (Adjusted) 267.2 mL/hr over 60 Minutes Intravenous Every 8 hours 07/27/23 0849     07/27/23 0245  cefTRIAXone (ROCEPHIN) 2 g in sodium chloride 0.9 % 100 mL IVPB        2 g 200 mL/hr over 30 Minutes Intravenous Once 07/27/23 0235 07/27/23 0340   07/27/23 0245  vancomycin (VANCOCIN) IVPB 1000 mg/200 mL premix  Status:  Discontinued        1,000 mg 200 mL/hr over 60 Minutes Intravenous  Once 07/27/23 0235 07/27/23 0244   07/27/23 0245  vancomycin (VANCOREADY) IVPB 2000 mg/400 mL        2,000 mg 200 mL/hr over 120 Minutes Intravenous  Once 07/27/23 0244 07/27/23 1027       Procedures: 07-27-2023 lumbar puncture  Consultants: Neurology ID    Assessment and Plan: * Aseptic meningitis 07-28-2023 pt underwent LP under fluoro. Meningitis/encephalitis panel negative. ID consulted. Felt that pt may have HSV meningitis despite negative test as she also has hx of cold sore and a  current genital lesion. ID recommended valtex 1000 mg tid x 9 days. Prn zofran. Stay well hydrated.  Genital lesion, female 07-28-2023 ID recommends valtrex 1000 mg tid x 9 days  Leukocytosis 07-28-2023 may be due to pain from HA vs viral meningitis.  Severe headache 07-28-2023 pt had elevated opening pressure on LP of 39 cm H2O. MRI brian today shows partial empty sella. Pt should f/u with neurology for further workup of possible intracranial hypertension as the source of her  headaches.  Chronically on opiate therapy 07-28-2023 pt claims to be on 185 mg po daily of methadone. She states she goes to [facility]. Only information we have in hospital computer system is that she is on 85 mg. Pt given 85 mg and then 100 mg of methadone. Pt was told that we were going to call [facility] in the AM to verify her dose.  Pt wanted to be discharged immediately today and not stay for further antiviral therapy. Still unable to verify her methadone dose as [facility] is closed on Sunday.  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 Pt on methadone from [facility].  Obesity, Class III, BMI 40-49.9 (morbid obesity) (HCC) Estimated body mass index is 42.44 kg/m as calculated from the following:   Height as of 07/25/23: 5\' 7"  (1.702 m).   Weight as of 07/25/23: 122.9 kg.   DVT prophylaxis: SCDs Start: 07/27/23 1610    Code Status: Full Code Family Communication: discussed with pt and her partner Kisha Disposition Plan: return home Reason for continuing need for hospitalization: medically stable. Pt requesting DC to home.  Objective: Vitals:   07/27/23 1928 07/27/23 2358 07/28/23 0627 07/28/23 0924  BP: (!) 117/91 (!) 132/91 (!) 149/85 112/74  Pulse: 62 (!) 59 64 70  Resp: 20 20 20 18   Temp: 99.4 F (37.4 C) 98.3 F (36.8 C) 98.6 F (37 C) 99.4 F (37.4 C)  TempSrc: Oral Oral Oral Oral  SpO2: 100%   100%    Intake/Output Summary (Last 24 hours) at 07/28/2023 1437 Last data filed at 07/28/2023 9604 Gross per 24 hour  Intake 1298.74 ml  Output 725 ml  Net 573.74 ml   There were no vitals filed for this visit.  Examination:  Physical Exam Vitals and nursing note reviewed.  Constitutional:      General: She is not in acute distress.    Appearance: She is obese. She is not toxic-appearing or diaphoretic.  HENT:     Head: Normocephalic and atraumatic.     Nose: Nose normal.  Pulmonary:     Effort: No respiratory distress.  Abdominal:      General: Abdomen is protuberant.  Neurological:     Mental Status: She is alert and oriented to person, place, and time.   Data Reviewed: I have personally reviewed following labs and imaging studies  CBC: Recent Labs  Lab 07/27/23 0109  WBC 17.3*  NEUTROABS 11.4*  HGB 11.7*  HCT 38.8  MCV 80.2  PLT 436*   Basic Metabolic Panel: Recent Labs  Lab 07/27/23 0109 07/27/23 0915  NA 137  --   K 4.0  --   CL 105  --   CO2 25  --   GLUCOSE 101*  --   BUN 10  --   CREATININE 0.88  --   CALCIUM 9.9  --   MG  --  2.0   GFR: Estimated Creatinine Clearance: 122.4 mL/min (by C-G formula based on SCr of 0.88 mg/dL). HbA1C: Recent  Labs    07/27/23 0915  HGBA1C 5.8*   CBG: Recent Labs  Lab 07/27/23 1113 07/27/23 1630 07/27/23 2150 07/28/23 0727 07/28/23 1157  GLUCAP 126* 169* 160* 147* 147*   Lumbar Puncture Labs: Lab Results  Component Value Date/Time   TUBENUMBER 1 07/27/2023 11:05 AM   COLORCSF COLORLESS 07/27/2023 11:05 AM   APPEARCSF CLEAR 07/27/2023 11:05 AM   SUPERNATCSF COLORLESS 07/27/2023 11:05 AM   RBCCOUNTCSF 53 (H) 07/27/2023 11:05 AM   WBCCSF 130 (HH) 07/27/2023 11:05 AM   POLYSCSF 1 07/27/2023 11:05 AM   LYMPHSCSF 79 07/27/2023 11:05 AM   EOSCSF 1 07/27/2023 11:05 AM   Lab Results  Component Value Date/Time   GLUCCSF 67 07/27/2023 11:05 AM   PROTEINCSF 46 (H) 07/27/2023 11:05 AM   Lab Results  Component Value Date/Time   SDES  07/27/2023 11:05 AM    LUMBAR Performed at [facility], [address] [place], [place] [address]    SDES LUMBAR 07/27/2023 11:05 AM   SPECREQUEST  07/27/2023 11:05 AM    NONE Performed at [facility], [address] [place], [place] [address]    SPECREQUEST NONE 07/27/2023 11:05 AM   GRAMSTAIN RARE WBC SEEN NO ORGANISMS SEEN  07/27/2023 11:05 AM   GRAMSTAIN  07/27/2023 11:05 AM    NO ORGANISMS SEEN WBC SEEN WBC PRESENT,BOTH PMN AND MONONUCLEAR Performed at [facility], [address], [place], [place] [address]    CULT  07/27/2023 11:05 AM    NO GROWTH < 24 HOURS Performed at [facility], [address] [place], [place] [address]    REPTSTATUS PENDING 07/27/2023 11:05 AM   REPTSTATUS 07/27/2023 FINAL 07/27/2023 11:05 AM     Recent Results (from the past 240 hours)  Resp panel by RT-PCR (RSV, Flu A&B, Covid) Anterior Nasal Swab     Status: None   Collection Time: 07/25/23  6:47 AM   Specimen: Anterior Nasal Swab  Result Value Ref Range Status   SARS Coronavirus 2 by RT PCR NEGATIVE NEGATIVE Final    Comment: (NOTE) SARS-CoV-2 target nucleic acids are NOT DETECTED.  The SARS-CoV-2 RNA is generally detectable in upper respiratory specimens during the acute phase of infection. The lowest concentration of SARS-CoV-2 viral copies this assay can detect is 138 copies/mL. A negative result does not preclude SARS-Cov-2 infection and should not be used as the sole basis for treatment or other patient management decisions. A negative result may occur with  improper specimen collection/handling, submission of specimen other than nasopharyngeal swab, presence of viral mutation(s) within the areas targeted by this assay, and inadequate number of viral copies(<138 copies/mL). A negative result must be combined with clinical observations, patient history, and epidemiological information. The expected result is Negative.  Fact Sheet for Patients:  BloggerCourse.com  Fact Sheet for Healthcare Providers:  SeriousBroker.it  This test is no t yet approved or cleared by the [place] FDA and  has been authorized for detection and/or diagnosis of SARS-CoV-2 by FDA under an Emergency Use Authorization (EUA). This EUA will remain  in effect (meaning this test can be used) for the duration of the COVID-19 declaration under Section 564(b)(1) of the Act, 21 U.S.C.section  360bbb-3(b)(1), unless the authorization is terminated  or revoked sooner.       Influenza A by PCR NEGATIVE NEGATIVE Final   Influenza B by PCR NEGATIVE NEGATIVE Final    Comment: (NOTE) The Xpert Xpress SARS-CoV-2/FLU/RSV plus assay is intended  as an aid in the diagnosis of influenza from Nasopharyngeal swab specimens and should not be used as a sole basis for treatment. Nasal washings and aspirates are unacceptable for Xpert Xpress SARS-CoV-2/FLU/RSV testing.  Fact Sheet for Patients: BloggerCourse.com  Fact Sheet for Healthcare Providers: SeriousBroker.it  This test is not yet approved or cleared by the [place] FDA and has been authorized for detection and/or diagnosis of SARS-CoV-2 by FDA under an Emergency Use Authorization (EUA). This EUA will remain in effect (meaning this test can be used) for the duration of the COVID-19 declaration under Section 564(b)(1) of the Act, 21 U.S.C. section 360bbb-3(b)(1), unless the authorization is terminated or revoked.     Resp Syncytial Virus by PCR NEGATIVE NEGATIVE Final    Comment: (NOTE) Fact Sheet for Patients: BloggerCourse.com  Fact Sheet for Healthcare Providers: SeriousBroker.it  This test is not yet approved or cleared by the [place] FDA and has been authorized for detection and/or diagnosis of SARS-CoV-2 by FDA under an Emergency Use Authorization (EUA). This EUA will remain in effect (meaning this test can be used) for the duration of the COVID-19 declaration under Section 564(b)(1) of the Act, 21 U.S.C. section 360bbb-3(b)(1), unless the authorization is terminated or revoked.  Performed at [facility], [address], [place], [place] [address]   Resp panel by RT-PCR (RSV, Flu A&B, Covid) Anterior Nasal Swab     Status: None   Collection Time: 07/26/23 10:05 PM   Specimen: Anterior  Nasal Swab  Result Value Ref Range Status   SARS Coronavirus 2 by RT PCR NEGATIVE NEGATIVE Final    Comment: (NOTE) SARS-CoV-2 target nucleic acids are NOT DETECTED.  The SARS-CoV-2 RNA is generally detectable in upper respiratory specimens during the acute phase of infection. The lowest concentration of SARS-CoV-2 viral copies this assay can detect is 138 copies/mL. A negative result does not preclude SARS-Cov-2 infection and should not be used as the sole basis for treatment or other patient management decisions. A negative result may occur with  improper specimen collection/handling, submission of specimen other than nasopharyngeal swab, presence of viral mutation(s) within the areas targeted by this assay, and inadequate number of viral copies(<138 copies/mL). A negative result must be combined with clinical observations, patient history, and epidemiological information. The expected result is Negative.  Fact Sheet for Patients:  BloggerCourse.com  Fact Sheet for Healthcare Providers:  SeriousBroker.it  This test is no t yet approved or cleared by the [place] FDA and  has been authorized for detection and/or diagnosis of SARS-CoV-2 by FDA under an Emergency Use Authorization (EUA). This EUA will remain  in effect (meaning this test can be used) for the duration of the COVID-19 declaration under Section 564(b)(1) of the Act, 21 U.S.C.section 360bbb-3(b)(1), unless the authorization is terminated  or revoked sooner.       Influenza A by PCR NEGATIVE NEGATIVE Final   Influenza B by PCR NEGATIVE NEGATIVE Final    Comment: (NOTE) The Xpert Xpress SARS-CoV-2/FLU/RSV plus assay is intended as an aid in the diagnosis of influenza from Nasopharyngeal swab specimens and should not be used as a sole basis for treatment. Nasal washings and aspirates are unacceptable for Xpert Xpress SARS-CoV-2/FLU/RSV testing.  Fact Sheet for  Patients: BloggerCourse.com  Fact Sheet for Healthcare Providers: SeriousBroker.it  This test is not yet approved or cleared by the [place] FDA and has been authorized for detection and/or diagnosis of SARS-CoV-2 by FDA under an Emergency Use Authorization (EUA). This  EUA will remain in effect (meaning this test can be used) for the duration of the COVID-19 declaration under Section 564(b)(1) of the Act, 21 U.S.C. section 360bbb-3(b)(1), unless the authorization is terminated or revoked.     Resp Syncytial Virus by PCR NEGATIVE NEGATIVE Final    Comment: (NOTE) Fact Sheet for Patients: BloggerCourse.com  Fact Sheet for Healthcare Providers: SeriousBroker.it  This test is not yet approved or cleared by the [place] FDA and has been authorized for detection and/or diagnosis of SARS-CoV-2 by FDA under an Emergency Use Authorization (EUA). This EUA will remain in effect (meaning this test can be used) for the duration of the COVID-19 declaration under Section 564(b)(1) of the Act, 21 U.S.C. section 360bbb-3(b)(1), unless the authorization is terminated or revoked.  Performed at [facility], [address] [place], [place] [address]   Respiratory (~20 pathogens) panel by PCR     Status: None   Collection Time: 07/26/23 10:05 PM   Specimen: Nasopharyngeal Swab; Respiratory  Result Value Ref Range Status   Adenovirus NOT DETECTED NOT DETECTED Final   Coronavirus 229E NOT DETECTED NOT DETECTED Final    Comment: (NOTE) The Coronavirus on the Respiratory Panel, DOES NOT test for the novel  Coronavirus (2019 nCoV)    Coronavirus HKU1 NOT DETECTED NOT DETECTED Final   Coronavirus NL63 NOT DETECTED NOT DETECTED Final   Coronavirus OC43 NOT DETECTED NOT DETECTED Final   Metapneumovirus NOT DETECTED NOT DETECTED Final   Rhinovirus / Enterovirus NOT  DETECTED NOT DETECTED Final   Influenza A NOT DETECTED NOT DETECTED Final   Influenza B NOT DETECTED NOT DETECTED Final   Parainfluenza Virus 1 NOT DETECTED NOT DETECTED Final   Parainfluenza Virus 2 NOT DETECTED NOT DETECTED Final   Parainfluenza Virus 3 NOT DETECTED NOT DETECTED Final   Parainfluenza Virus 4 NOT DETECTED NOT DETECTED Final   Respiratory Syncytial Virus NOT DETECTED NOT DETECTED Final   Bordetella pertussis NOT DETECTED NOT DETECTED Final   Bordetella Parapertussis NOT DETECTED NOT DETECTED Final   Chlamydophila pneumoniae NOT DETECTED NOT DETECTED Final   Mycoplasma pneumoniae NOT DETECTED NOT DETECTED Final    Comment: Performed at [facility], [address], [place], [place] [address]  CSF culture w Gram Stain     Status: None (Preliminary result)   Collection Time: 07/27/23 11:05 AM   Specimen: Lumbar Puncture; Cerebrospinal Fluid  Result Value Ref Range Status   Specimen Description   Final    LUMBAR Performed at [facility], [address] [place], [place] [address]    Special Requests   Final    NONE Performed at [facility], [address], [place], [place] [address]    Gram Stain RARE WBC SEEN NO ORGANISMS SEEN   Final   Culture   Final    NO GROWTH < 24 HOURS Performed at [facility], [address], [place], [place] [address]    Report Status PENDING  Incomplete  Gram stain     Status: None   Collection Time: 07/27/23 11:05 AM   Specimen: Lumbar Puncture; Cerebrospinal Fluid  Result Value Ref Range Status   Specimen Description LUMBAR  Final   Special Requests NONE  Final   Gram Stain   Final    NO ORGANISMS SEEN WBC SEEN WBC PRESENT,BOTH PMN AND MONONUCLEAR Performed at [facility], [address], [place], [place] [address]    Report Status 07/27/2023 FINAL  Final     Radiology Studies: MR BRAIN W WO CONTRAST Result Date: 07/28/2023 CLINICAL DATA:  Provided  history: Meningitis/CNS infection suspected. EXAM: MRI HEAD WITHOUT AND WITH CONTRAST TECHNIQUE: Multiplanar, multiecho pulse sequences of the brain and surrounding structures were obtained without and with intravenous contrast. CONTRAST:  10mL GADAVIST GADOBUTROL 1 MMOL/ML IV SOLN COMPARISON:  CT venogram head 07/27/2023. CTA head/neck 07/27/2023. Noncontrast head CT 07/25/2023. FINDINGS: Intermittently motion degraded examination. Most notably, the axial T2 sequence is severely motion degraded and the coronal T2 sequence is moderate to severely motion degraded. Within this limitation, findings are as follows. Brain: Cerebral volume is normal. 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 No cortical encephalomalacia is identified. No significant cerebral white matter disease. There is no acute infarct. No chronic intracranial blood products. No extra-axial fluid collection. No midline shift. No pathologic intracranial enhancement identified. Vascular: Within the limitations of motion degradation, no definite loss of proximal arterial flow voids. Skull and upper cervical spine: No focal worrisome marrow lesion. Sinuses/Orbits: No mass or acute finding within the imaged orbits. 1.6 cm right maxillary sinus mucous retention cyst or polyp. IMPRESSION: 1. Intermittently motion degraded examination, as described. Within this limitation, findings are as follows. 2.  No evidence of an acute intracranial abnormality. 3. Partially empty sella turcica. This finding can reflect incidental anatomic variation, or alternatively, it can be associated with chronic idiopathic intracranial hypertension (pseudotumor cerebri). 4. 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 5. 1.6 cm right maxillary sinus mucous retention cyst or polyp. Electronically Signed   By: Jackey Loge D.O.   On: 07/28/2023 11:35   DG FL GUIDED LUMBAR PUNCTURE Result Date: 07/27/2023 CLINICAL DATA:  Severe headache, nuchal rigidity, and concern for possible  meningitis. EXAM: LUMBAR PUNCTURE UNDER FLUOROSCOPY PROCEDURE: An appropriate skin entry site was determined fluoroscopically. Operator donned sterile gloves and mask. Skin site was marked, then prepped with Betadine, draped in usual sterile fashion, and infiltrated locally with 1% lidocaine. A 20 gauge spinal needle advanced into the thecal sac at L4-5 from a left interlaminar approach. Clear colorless CSF spontaneously returned, with opening pressure of 39 cm water with patient in the prone position. Approximately 13 ml CSF were collected and divided among 4 sterile vials for the requested laboratory studies. Closing pressure of 21 cm of water with patient in the prone position. The needle was then removed. The patient tolerated the procedure well and there were no complications. FLUOROSCOPY: Radiation Exposure Index (as provided by the fluoroscopic device): 6.8 mGy Kerma IMPRESSION: Technically successful lumbar puncture under fluoroscopy. In the prone position, opening pressure was 39 cm of water and closing pressure was 21 cm of water. This exam was performed by Corrin Parker, PA-C, and was supervised and interpreted by Dr. Herbie Baltimore. Electronically Signed   By: Gaylyn Rong M.D.   On: 07/27/2023 11:38   CT ANGIO HEAD NECK W WO CM Result Date: 07/27/2023 CLINICAL DATA:  Provided history: Subdural hemorrhage. Severe headache. Rule out hemorrhage. EXAM: CT ANGIOGRAPHY HEAD AND NECK WITH AND WITHOUT CONTRAST TECHNIQUE: Multidetector CT imaging of the head and neck was performed using the standard protocol during bolus administration of intravenous contrast. Multiplanar CT image reconstructions and MIPs were obtained to evaluate the vascular anatomy. Carotid stenosis measurements (when applicable) are obtained utilizing NASCET criteria, using the distal internal carotid diameter as the denominator. RADIATION DOSE REDUCTION: This exam was performed according to the departmental dose-optimization program  which includes automated exposure control, adjustment of the mA and/or  kV according to patient size and/or use of iterative reconstruction technique. CONTRAST:  OMNIPAQUE IOHEXOL 350 MG/ML SOLN COMPARISON:  Non-contrast head CT 07/25/2023. FINDINGS: CT HEAD FINDINGS Brain: Cerebral volume is normal. 1 cm tectal plate lipoma. Partially empty sella turcica. There is no acute intracranial hemorrhage. No demarcated cortical infarct. No extra-axial fluid collection. No midline shift. Vascular: No hyperdense vessel. Skull: No calvarial fracture or aggressive osseous lesion. Sinuses/Orbits: No orbital mass or acute orbital finding. 1.6 cm mucous retention cyst or polyp within the right maxillary sinus. Review of the MIP images confirms the above findings CTA NECK FINDINGS Moderately motion degraded examination at the level of the mid neck. Within this limitation, findings are as follows. Aortic arch: Common origin of the innominate left common carotid arteries. The visualized thoracic aorta is normal in caliber. Streak/beam hardening artifact arising from a dense contrast bolus partially obscures the right subclavian artery. Within this limitation, there is no appreciable hemodynamically significant innominate or proximal subclavian artery stenosis. Right carotid system: CCA and ICA patent within the neck without definite stenosis. Left carotid system: CCA and ICA patent within the neck without definite stenosis. Vertebral arteries: Patent within the neck Skeleton: No acute fracture or aggressive osseous lesion. Partially imaged levocurvature of the lower cervical and upper thoracic spine. Other neck: Nonspecific mildly enlarged left II lymph node measuring up to 12 mm in short axis. Upper chest: No consolidation within the imaged lung apices. Review of the MIP images confirms the above findings CTA HEAD FINDINGS Anterior circulation: The intracranial internal carotid arteries are patent. The M1 middle cerebral  arteries are patent. No M2 proximal branch occlusion or high-grade proximal stenosis. The anterior cerebral arteries are patent. Hypoplastic right A1 segment. No intracranial aneurysm is identified. Posterior circulation: The intracranial vertebral arteries are patent. The basilar artery is patent. The posterior cerebral arteries are patent. Posterior communicating arteries are diminutive or absent, bilaterally. Venous sinuses: See separately reported CT venogram head. Anatomic variants: As described. Review of the MIP images confirms the above findings IMPRESSION: Non-contrast head CT: 1.  No evidence of an acute intracranial abnormality. 2. Partially empty sella turcica. This finding can reflect incidental anatomic variation, or alternatively, it can be associated with chronic idiopathic intracranial hypertension (pseudotumor cerebri). 3. 1 cm tectal plate lipoma. 4. 1.6 cm right maxillary sinus mucous retention cyst or polyp. CTA neck: 1. The examination is moderately motion degraded at the level of the mid neck. Within this limitation, the common carotid, internal carotid and vertebral arteries are patent within the neck without definite stenosis. 2. Nonspecific mildly enlarged left level II cervical lymph nodes, measuring up to 12 mm in short axis. CTA neck: 1. No proximal intracranial large vessel occlusion or high-grade proximal arterial stenosis. 2. No intracranial aneurysm identified. Electronically Signed   By: Jackey Loge D.O.   On: 07/27/2023 10:43   CT VENOGRAM HEAD Result Date: 07/27/2023 CLINICAL DATA:  Provided history: Dural venous sinus thrombosis suspected. Additional history provided: Headache behind eyes. EXAM: CT VENOGRAM HEAD TECHNIQUE: Venographic phase images of the brain were obtained following the administration of intravenous contrast. Multiplanar reformats and maximum intensity projections were generated. RADIATION DOSE REDUCTION: This exam was performed according to the departmental  dose-optimization program which includes automated exposure control, adjustment of the mA and/or kV according to patient size and/or use of iterative reconstruction technique. CONTRAST:  OMNIPAQUE IOHEXOL 350 MG/ML SOLN COMPARISON:  Head CT 07/25/2023. FINDINGS: The superior sagittal sinus, internal cerebral veins, vein of Galen,  straight sinus, transverse sinuses, sigmoid sinuses and visualized jugular veins are patent. There is no appreciable intracranial venous thrombosis. The right transverse and sigmoid dural venous sinuses are dominant. 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 IMPRESSION: 1. No evidence of dural venous sinus thrombosis. 2. Partially empty sella turcica. This finding can reflect incidental anatomic variation, or alternatively, it can be associated with chronic idiopathic intracranial hypertension (pseudotumor cerebri). Electronically Signed   By: Jackey Loge D.O.   On: 07/27/2023 10:31    Scheduled Meds:  ARIPiprazole  5 mg Oral Daily   insulin aspart  0-15 Units Subcutaneous TID WC   insulin aspart  0-5 Units Subcutaneous QHS   lithium carbonate  300 mg Oral TID   methadone  100 mg Oral Once   [START ON 07/29/2023] methadone  185 mg Oral Daily   nicotine  21 mg Transdermal Daily   valACYclovir  1,000 mg Oral TID   Continuous Infusions:  lactated ringers 125 mL/hr at 07/28/23 0938     LOS: 0 days   Time spent: 40 minutes  Carollee Herter, DO  [facility]  07/28/2023, 2:37 PM

## 2023-07-28 NOTE — Progress Notes (Signed)
Patient is very upset about her Methadone dose in the hospital. She has received methadone 85 mg PO this AM. She states she takes 185 mg daily and that this is prescribed to her by [facility]. Hospital pharmacy unable to verify this with [facility] due to it being Sunday and they are closed. [facility] opens on Monday at 5 AM.   Patient is stating she will go into withdrawal if she does not get her 185 mg methadone dose.

## 2023-07-28 NOTE — Assessment & Plan Note (Signed)
07-28-2023 pt had elevated opening pressure on LP of 39 cm H2O. MRI brian today shows partial empty sella. Pt should f/u with neurology for further workup of possible intracranial hypertension as the source of her headaches.

## 2023-07-28 NOTE — Progress Notes (Signed)
   07/28/23 1434  TOC Brief Assessment  Insurance and Status Reviewed  Patient has primary care physician Yes  Home environment has been reviewed Home  Prior level of function: Independent  Prior/Current Home Services No current home services  Social Drivers of Health Review SDOH reviewed no interventions necessary  Readmission risk has been reviewed Yes  Transition of care needs no transition of care needs at this time

## 2023-07-28 NOTE — Assessment & Plan Note (Signed)
07-28-2023 may be due to pain from HA vs viral meningitis.

## 2023-07-28 NOTE — Assessment & Plan Note (Signed)
07-28-2023 stable. Pt on methadone from [facility].

## 2023-07-28 NOTE — Assessment & Plan Note (Signed)
Estimated body mass index is 42.44 kg/m as calculated from the following:   Height as of 07/25/23: 5\' 7"  (1.702 m).   Weight as of 07/25/23: 122.9 kg.

## 2023-07-28 NOTE — Hospital Course (Addendum)
HPI: Melissa Castillo is a 33 y.o. female with medical history significant for morbid obesity, depression, diabetes, ADHD being admitted to the hospital with severe headache with nuchal rigidity and concern for possible meningitis.  Patient started having severe headache about 3 days ago, with photophobia.  She was treated in the emergency department and discharged home states that she never really felt better.  She came back to the ER last night, she now has what appears to be nuchal rigidity, photophobia, had a fever.  Denies any confusion, no sick contacts, no cough, congestion or other complaints.  In the emergency department, she was started on empiric IV Rocephin and vancomycin after LP was unsuccessful.   Significant Events: Admitted 07/26/2023 for suspected meningitis   Significant Labs: Na 137, K 4.0, BUN 10, Scr 0.88, glu 101 WBC 17.3, HgB 11.7, plt 436 CSF WBC 130, 79% lymph, 1% neutrophils, 19% macrophage Meningitis/encephalitis panel is negative HIV non-reactive  Significant Imaging Studies: CT head No acute finding or specific cause for headache  CT venogram head No evidence of dural venous sinus thrombosis. 2. Partially empty sella turcica. This finding can reflect incidental anatomic variation, or alternatively, it can be associated with chronic idiopathic intracranial  hypertension (pseudotumor cerebri). CT head angio No proximal intracranial large vessel occlusion or high-grade proximal arterial stenosis. 2. No intracranial aneurysm identified  Antibiotic Therapy: Anti-infectives (From admission, onward)    Start     Dose/Rate Route Frequency Ordered Stop   07/27/23 1200  vancomycin (VANCOREADY) IVPB 1250 mg/250 mL        1,250 mg 166.7 mL/hr over 90 Minutes Intravenous Every 8 hours 07/27/23 0849     07/27/23 1000  cefTRIAXone (ROCEPHIN) 2 g in sodium chloride 0.9 % 100 mL IVPB        2 g 200 mL/hr over 30 Minutes Intravenous Every 12 hours 07/27/23 0827     07/27/23  1000  acyclovir (ZOVIRAX) 860 mg in dextrose 5 % 250 mL IVPB        10 mg/kg  86.1 kg (Adjusted) 267.2 mL/hr over 60 Minutes Intravenous Every 8 hours 07/27/23 0849     07/27/23 0245  cefTRIAXone (ROCEPHIN) 2 g in sodium chloride 0.9 % 100 mL IVPB        2 g 200 mL/hr over 30 Minutes Intravenous Once 07/27/23 0235 07/27/23 0340   07/27/23 0245  vancomycin (VANCOCIN) IVPB 1000 mg/200 mL premix  Status:  Discontinued        1,000 mg 200 mL/hr over 60 Minutes Intravenous  Once 07/27/23 0235 07/27/23 0244   07/27/23 0245  vancomycin (VANCOREADY) IVPB 2000 mg/400 mL        2,000 mg 200 mL/hr over 120 Minutes Intravenous  Once 07/27/23 0244 07/27/23 1610       Procedures: 07-27-2023 lumbar puncture  Consultants: Neurology ID

## 2023-07-28 NOTE — Assessment & Plan Note (Signed)
07-28-2023 ID recommends valtrex 1000 mg tid x 9 days

## 2023-07-28 NOTE — Assessment & Plan Note (Signed)
07-28-2023 pt underwent LP under fluoro. Meningitis/encephalitis panel negative. ID consulted. 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 ID recommended valtex 1000 mg tid x 9 days. Prn zofran. Stay well hydrated.

## 2023-07-28 NOTE — Subjective & Objective (Signed)
Pt seen and examined. Pt's partner at bedside Kisha. Discussed with ID. Felt that pt's aseptic meningitis could be HVS even if meningitis/encephalitis panel negative. Pt with concurrent genital lesion and hx of cold sores.  MRI brain today negative for acute CVA.  Pt wanted to go home today.

## 2023-07-28 NOTE — Assessment & Plan Note (Signed)
07-28-2023 pt claims to be on 185 mg po daily of methadone. She states she goes to [facility]. Only information we have in hospital computer system is that she is on 85 mg. Pt given 85 mg and then 100 mg of methadone. Pt was told that we were going to call [facility] in the AM to verify her dose.  Pt wanted to be discharged immediately today and not stay for further antiviral therapy. Still unable to verify her methadone dose as [facility] is closed on Sunday.

## 2023-07-28 NOTE — Discharge Summary (Signed)
[facility] Physician Discharge Summary   Patient name: Melissa Castillo  Admit date:     07/26/2023  Discharge date: 07/28/2023  Attending Physician: Maryln Gottron [[id number]]  Discharge Physician: Carollee Herter   PCP: System, Provider Not In  Admitted From: Home  Disposition:  Home  Recommendations for Outpatient Follow-up:  Follow up with PCP in 1-2 weeks Consider outpatient neurology referral for partial empty sella syndrome Please follow up on the following pending results: CSF culture results  Home Health:No Equipment/Devices: None  Discharge Condition:Stable CODE STATUS:FULL Diet recommendation: Heart Healthy Fluid Restriction: None  Hospital Summary: HPI: Melissa Castillo is a 33 y.o. female with medical history significant for morbid obesity, depression, diabetes, ADHD being admitted to the hospital with severe headache with nuchal rigidity and concern for possible meningitis.  Patient started having severe headache about 3 days ago, with photophobia.  She was treated in the emergency department and discharged home states that she never really felt better.  She came back to the ER last night, she now has what appears to be nuchal rigidity, photophobia, had a fever.  Denies any confusion, no sick contacts, no cough, congestion or other complaints.  In the emergency department, she was started on empiric IV Rocephin and vancomycin after LP was unsuccessful.   Significant Events: Admitted 07/26/2023 for suspected meningitis   Significant Labs: Na 137, K 4.0, BUN 10, Scr 0.88, glu 101 WBC 17.3, HgB 11.7, plt 436 CSF WBC 130, 79% lymph, 1% neutrophils, 19% macrophage Meningitis/encephalitis panel is negative HIV non-reactive  Significant Imaging Studies: CT head No acute finding or specific cause for headache  CT venogram head No evidence of dural venous sinus thrombosis. 2. Partially empty sella turcica. This finding can reflect incidental anatomic variation, or  alternatively, it can be associated with chronic idiopathic intracranial  hypertension (pseudotumor cerebri). CT head angio No proximal intracranial large vessel occlusion or high-grade proximal arterial stenosis. 2. No intracranial aneurysm identified  Antibiotic Therapy: Anti-infectives (From admission, onward)    Start     Dose/Rate Route Frequency Ordered Stop   07/27/23 1200  vancomycin (VANCOREADY) IVPB 1250 mg/250 mL        1,250 mg 166.7 mL/hr over 90 Minutes Intravenous Every 8 hours 07/27/23 0849     07/27/23 1000  cefTRIAXone (ROCEPHIN) 2 g in sodium chloride 0.9 % 100 mL IVPB        2 g 200 mL/hr over 30 Minutes Intravenous Every 12 hours 07/27/23 0827     07/27/23 1000  acyclovir (ZOVIRAX) 860 mg in dextrose 5 % 250 mL IVPB        10 mg/kg  86.1 kg (Adjusted) 267.2 mL/hr over 60 Minutes Intravenous Every 8 hours 07/27/23 0849     07/27/23 0245  cefTRIAXone (ROCEPHIN) 2 g in sodium chloride 0.9 % 100 mL IVPB        2 g 200 mL/hr over 30 Minutes Intravenous Once 07/27/23 0235 07/27/23 0340   07/27/23 0245  vancomycin (VANCOCIN) IVPB 1000 mg/200 mL premix  Status:  Discontinued        1,000 mg 200 mL/hr over 60 Minutes Intravenous  Once 07/27/23 0235 07/27/23 0244   07/27/23 0245  vancomycin (VANCOREADY) IVPB 2000 mg/400 mL        2,000 mg 200 mL/hr over 120 Minutes Intravenous  Once 07/27/23 0244 07/27/23 8295       Procedures: 07-27-2023 lumbar puncture  Consultants: Neurology ID   Hospital Course by Problem: * Aseptic meningitis 07-28-2023 pt  underwent LP under fluoro. Meningitis/encephalitis panel negative. ID consulted. 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 ID recommended valtex 1000 mg tid x 9 days. Prn zofran. Stay well hydrated.  Genital lesion, female 07-28-2023 ID recommends valtrex 1000 mg tid x 9 days  Leukocytosis 07-28-2023 may be due to pain from HA vs viral  meningitis.  Severe headache 07-28-2023 pt had elevated opening pressure on LP of 39 cm H2O. MRI brian today shows partial empty sella. Pt should f/u with neurology for further workup of possible intracranial hypertension as the source of her headaches.  Chronically on opiate therapy 07-28-2023 pt claims to be on 185 mg po daily of methadone. She states she goes to [facility]. Only information we have in hospital computer system is that she is on 85 mg. Pt given 85 mg and then 100 mg of methadone. Pt was told that we were going to call [facility] in the AM to verify her dose.  Pt wanted to be discharged immediately today and not stay for further antiviral therapy. Still unable to verify her methadone dose as [facility] is closed on Sunday.  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 Pt on methadone from [facility].  Obesity, Class III, BMI 40-49.9 (morbid obesity) (HCC) Estimated body mass index is 42.44 kg/m as calculated from the following:   Height as of 07/25/23: 5\' 7"  (1.702 m).   Weight as of 07/25/23: 122.9 kg.    Discharge Diagnoses:  Principal Problem:   Aseptic meningitis Active Problems:   Genital lesion, female   History of intravenous drug use in remission   Chronically on opiate therapy   Severe headache   Leukocytosis   Obesity, Class III, BMI 40-49.9 (morbid obesity) (HCC)  Discharge Instructions  Discharge Instructions     Diet - low sodium heart healthy   Complete by: As directed    Discharge instructions   Complete by: As directed    1. Follow up with your primary care provider in 1-2 weeks after discharge from hospital. 2. Stay well hydrated with clear liquids while taking Valtrex for the next 9 days.   Increase activity slowly   Complete by: As directed       Allergies as of 07/28/2023   No Known Allergies      Medication List     TAKE these medications    albuterol 108 (90 Base) MCG/ACT inhaler Commonly known as:  VENTOLIN HFA Inhale 1-2 puffs into the lungs every 6 (six) hours as needed for wheezing or shortness of breath. What changed: how much to take   ARIPiprazole 5 MG tablet Commonly known as: ABILIFY Take 5 mg by mouth at bedtime.   citalopram 20 MG tablet Commonly known as: CELEXA Take 20 mg by mouth daily.   cloNIDine 0.1 MG tablet Commonly known as: CATAPRES Take 0.1 mg by mouth 3 (three) times daily.   fenofibrate 145 MG tablet Commonly known as: TRICOR Take 145 mg by mouth daily.   gabapentin 100 MG capsule Commonly known as: NEURONTIN Take 100 mg by mouth 3 (three) times daily.   hydrOXYzine 25 MG tablet Commonly known as: ATARAX Take 25-50 mg by mouth 3 (three) times daily as needed.   Linzess 72 MCG capsule Generic drug: linaclotide Take 72 mcg by mouth daily as needed (constipation).   lithium 300 MG tablet Take 300 mg by mouth 3 (three) times daily.  metFORMIN 500 MG tablet Commonly known as: GLUCOPHAGE Take 500 mg by mouth daily.   methadone 10 MG/5ML solution Commonly known as: DOLOPHINE Take 185 mg by mouth daily.   nicotine 21 mg/24hr patch Commonly known as: NICODERM CQ - dosed in mg/24 hours Place 21 mg onto the skin daily.   nicotine polacrilex 4 MG gum Commonly known as: NICORETTE Take 4 mg by mouth as needed for smoking cessation.   ondansetron 4 MG tablet Commonly known as: Zofran Take 1 tablet (4 mg total) by mouth every 6 (six) hours as needed for nausea or vomiting.   QUEtiapine 50 MG tablet Commonly known as: SEROQUEL Take 50 mg by mouth at bedtime.   Trulicity 0.75 MG/0.5ML Soaj Generic drug: Dulaglutide Inject 0.75 mg into the skin once a week.   valACYclovir 1000 MG tablet Commonly known as: VALTREX Take 500 mg by mouth daily as needed (cold sores). What changed: Another medication with the same name was added. Make sure you understand how and when to take each.   valACYclovir 1000 MG tablet Commonly known as: Valtrex Take  1 tablet (1,000 mg total) by mouth 3 (three) times daily for 9 days. What changed: You were already taking a medication with the same name, and this prescription was added. Make sure you understand how and when to take each.   Vraylar 3 MG capsule Generic drug: cariprazine Take 3 mg by mouth daily.        No Known Allergies  Discharge Exam: Vitals:   07/28/23 0627 07/28/23 0924  BP: (!) 149/85 112/74  Pulse: 64 70  Resp: 20 18  Temp: 98.6 F (37 C) 99.4 F (37.4 C)  SpO2:  100%   Physical Exam Vitals and nursing note reviewed.  Constitutional:      General: She is not in acute distress.    Appearance: She is obese. She is not toxic-appearing or diaphoretic.  HENT:     Head: Normocephalic and atraumatic.     Nose: Nose normal.  Pulmonary:     Effort: No respiratory distress.  Abdominal:     General: Abdomen is protuberant.  Neurological:     Mental Status: She is alert and oriented to person, place, and time.    The results of significant diagnostics from this hospitalization (including imaging, microbiology, ancillary and laboratory) are listed below for reference.    Microbiology: Recent Results (from the past 240 hours)  Resp panel by RT-PCR (RSV, Flu A&B, Covid) Anterior Nasal Swab     Status: None   Collection Time: 07/25/23  6:47 AM   Specimen: Anterior Nasal Swab  Result Value Ref Range Status   SARS Coronavirus 2 by RT PCR NEGATIVE NEGATIVE Final    Comment: (NOTE) SARS-CoV-2 target nucleic acids are NOT DETECTED.  The SARS-CoV-2 RNA is generally detectable in upper respiratory specimens during the acute phase of infection. The lowest concentration of SARS-CoV-2 viral copies this assay can detect is 138 copies/mL. A negative result does not preclude SARS-Cov-2 infection and should not be used as the sole basis for treatment or other patient management decisions. A negative result may occur with  improper specimen collection/handling, submission of  specimen other than nasopharyngeal swab, presence of viral mutation(s) within the areas targeted by this assay, and inadequate number of viral copies(<138 copies/mL). A negative result must be combined with clinical observations, patient history, and epidemiological information. The expected result is Negative.  Fact Sheet for Patients:  BloggerCourse.com  Fact Sheet for Healthcare Providers:  SeriousBroker.it  This test is no t yet approved or cleared by the [place] and  has been authorized for detection and/or diagnosis of SARS-CoV-2 by FDA under an Emergency Use Authorization (EUA). This EUA will remain  in effect (meaning this test can be used) for the duration of the COVID-19 declaration under Section 564(b)(1) of the Act, 21 U.S.C.section 360bbb-3(b)(1), unless the authorization is terminated  or revoked sooner.       Influenza A by PCR NEGATIVE NEGATIVE Final   Influenza B by PCR NEGATIVE NEGATIVE Final    Comment: (NOTE) The Xpert Xpress SARS-CoV-2/FLU/RSV plus assay is intended as an aid in the diagnosis of influenza from Nasopharyngeal swab specimens and should not be used as a sole basis for treatment. Nasal washings and aspirates are unacceptable for Xpert Xpress SARS-CoV-2/FLU/RSV testing.  Fact Sheet for Patients: BloggerCourse.com  Fact Sheet for Healthcare Providers: SeriousBroker.it  This test is not yet approved or cleared by the [place] FDA and has been authorized for detection and/or diagnosis of SARS-CoV-2 by FDA under an Emergency Use Authorization (EUA). This EUA will remain in effect (meaning this test can be used) for the duration of the COVID-19 declaration under Section 564(b)(1) of the Act, 21 U.S.C. section 360bbb-3(b)(1), unless the authorization is terminated or revoked.     Resp Syncytial Virus by PCR NEGATIVE NEGATIVE Final     Comment: (NOTE) Fact Sheet for Patients: BloggerCourse.com  Fact Sheet for Healthcare Providers: SeriousBroker.it  This test is not yet approved or cleared by the [place] FDA and has been authorized for detection and/or diagnosis of SARS-CoV-2 by FDA under an Emergency Use Authorization (EUA). This EUA will remain in effect (meaning this test can be used) for the duration of the COVID-19 declaration under Section 564(b)(1) of the Act, 21 U.S.C. section 360bbb-3(b)(1), unless the authorization is terminated or revoked.  Performed at [facility], [address] [place], [place] [address]   Resp panel by RT-PCR (RSV, Flu A&B, Covid) Anterior Nasal Swab     Status: None   Collection Time: 07/26/23 10:05 PM   Specimen: Anterior Nasal Swab  Result Value Ref Range Status   SARS Coronavirus 2 by RT PCR NEGATIVE NEGATIVE Final    Comment: (NOTE) SARS-CoV-2 target nucleic acids are NOT DETECTED.  The SARS-CoV-2 RNA is generally detectable in upper respiratory specimens during the acute phase of infection. The lowest concentration of SARS-CoV-2 viral copies this assay can detect is 138 copies/mL. A negative result does not preclude SARS-Cov-2 infection and should not be used as the sole basis for treatment or other patient management decisions. A negative result may occur with  improper specimen collection/handling, submission of specimen other than nasopharyngeal swab, presence of viral mutation(s) within the areas targeted by this assay, and inadequate number of viral copies(<138 copies/mL). A negative result must be combined with clinical observations, patient history, and epidemiological information. The expected result is Negative.  Fact Sheet for Patients:  BloggerCourse.com  Fact Sheet for Healthcare Providers:  SeriousBroker.it  This test is no t  yet approved or cleared by the [place] FDA and  has been authorized for detection and/or diagnosis of SARS-CoV-2 by FDA under an Emergency Use Authorization (EUA). This EUA will remain  in effect (meaning this test can be used) for the duration of the COVID-19 declaration under Section 564(b)(1) of the Act, 21 U.S.C.section 360bbb-3(b)(1), unless the authorization is terminated  or revoked sooner.       Influenza  A by PCR NEGATIVE NEGATIVE Final   Influenza B by PCR NEGATIVE NEGATIVE Final    Comment: (NOTE) The Xpert Xpress SARS-CoV-2/FLU/RSV plus assay is intended as an aid in the diagnosis of influenza from Nasopharyngeal swab specimens and should not be used as a sole basis for treatment. Nasal washings and aspirates are unacceptable for Xpert Xpress SARS-CoV-2/FLU/RSV testing.  Fact Sheet for Patients: BloggerCourse.com  Fact Sheet for Healthcare Providers: SeriousBroker.it  This test is not yet approved or cleared by the [place] FDA and has been authorized for detection and/or diagnosis of SARS-CoV-2 by FDA under an Emergency Use Authorization (EUA). This EUA will remain in effect (meaning this test can be used) for the duration of the COVID-19 declaration under Section 564(b)(1) of the Act, 21 U.S.C. section 360bbb-3(b)(1), unless the authorization is terminated or revoked.     Resp Syncytial Virus by PCR NEGATIVE NEGATIVE Final    Comment: (NOTE) Fact Sheet for Patients: BloggerCourse.com  Fact Sheet for Healthcare Providers: SeriousBroker.it  This test is not yet approved or cleared by the [place] FDA and has been authorized for detection and/or diagnosis of SARS-CoV-2 by FDA under an Emergency Use Authorization (EUA). This EUA will remain in effect (meaning this test can be used) for the duration of the COVID-19 declaration under Section 564(b)(1)  of the Act, 21 U.S.C. section 360bbb-3(b)(1), unless the authorization is terminated or revoked.  Performed at [facility], [address], [place], [place] [address]   Respiratory (~20 pathogens) panel by PCR     Status: None   Collection Time: 07/26/23 10:05 PM   Specimen: Nasopharyngeal Swab; Respiratory  Result Value Ref Range Status   Adenovirus NOT DETECTED NOT DETECTED Final   Coronavirus 229E NOT DETECTED NOT DETECTED Final    Comment: (NOTE) The Coronavirus on the Respiratory Panel, DOES NOT test for the novel  Coronavirus (2019 nCoV)    Coronavirus HKU1 NOT DETECTED NOT DETECTED Final   Coronavirus NL63 NOT DETECTED NOT DETECTED Final   Coronavirus OC43 NOT DETECTED NOT DETECTED Final   Metapneumovirus NOT DETECTED NOT DETECTED Final   Rhinovirus / Enterovirus NOT DETECTED NOT DETECTED Final   Influenza A NOT DETECTED NOT DETECTED Final   Influenza B NOT DETECTED NOT DETECTED Final   Parainfluenza Virus 1 NOT DETECTED NOT DETECTED Final   Parainfluenza Virus 2 NOT DETECTED NOT DETECTED Final   Parainfluenza Virus 3 NOT DETECTED NOT DETECTED Final   Parainfluenza Virus 4 NOT DETECTED NOT DETECTED Final   Respiratory Syncytial Virus NOT DETECTED NOT DETECTED Final   Bordetella pertussis NOT DETECTED NOT DETECTED Final   Bordetella Parapertussis NOT DETECTED NOT DETECTED Final   Chlamydophila pneumoniae NOT DETECTED NOT DETECTED Final   Mycoplasma pneumoniae NOT DETECTED NOT DETECTED Final    Comment: Performed at [facility], [address], [place], [place] [address]  CSF culture w Gram Stain     Status: None (Preliminary result)   Collection Time: 07/27/23 11:05 AM   Specimen: Lumbar Puncture; Cerebrospinal Fluid  Result Value Ref Range Status   Specimen Description   Final    LUMBAR Performed at [facility], [address] [place] [place], [place] [address]    Special Requests   Final    NONE Performed at [facility], [address] [place], [place] [address]    Gram Stain RARE WBC SEEN NO ORGANISMS SEEN   Final   Culture   Final  NO GROWTH < 24 HOURS Performed at [facility], [address], [place], [place] [address]    Report Status PENDING  Incomplete  Gram stain     Status: None   Collection Time: 07/27/23 11:05 AM   Specimen: Lumbar Puncture; Cerebrospinal Fluid  Result Value Ref Range Status   Specimen Description LUMBAR  Final   Special Requests NONE  Final   Gram Stain   Final    NO ORGANISMS SEEN WBC SEEN WBC PRESENT,BOTH PMN AND MONONUCLEAR Performed at [facility], [address] [place], [place] [address]    Report Status 07/27/2023 FINAL  Final     Labs: Basic Metabolic Panel: Recent Labs  Lab 07/27/23 0109 07/27/23 0915  NA 137  --   K 4.0  --   CL 105  --   CO2 25  --   GLUCOSE 101*  --   BUN 10  --   CREATININE 0.88  --   CALCIUM 9.9  --   MG  --  2.0   CBC: Recent Labs  Lab 07/27/23 0109  WBC 17.3*  NEUTROABS 11.4*  HGB 11.7*  HCT 38.8  MCV 80.2  PLT 436*   CBG: Recent Labs  Lab 07/27/23 1113 07/27/23 1630 07/27/23 2150 07/28/23 0727 07/28/23 1157  GLUCAP 126* 169* 160* 147* 147*   Hgb A1c Recent Labs    07/27/23 0915  HGBA1C 5.8*   Urinalysis    Component Value Date/Time   COLORURINE YELLOW 07/27/2023 0915   APPEARANCEUR HAZY (A) 07/27/2023 0915   LABSPEC 1.036 (H) 07/27/2023 0915   PHURINE 5.0 07/27/2023 0915   GLUCOSEU NEGATIVE 07/27/2023 0915   HGBUR LARGE (A) 07/27/2023 0915   BILIRUBINUR NEGATIVE 07/27/2023 0915   KETONESUR NEGATIVE 07/27/2023 0915   PROTEINUR 100 (A) 07/27/2023 0915   NITRITE NEGATIVE 07/27/2023 0915   LEUKOCYTESUR TRACE (A) 07/27/2023 0915   Sepsis Labs Recent Labs  Lab 07/27/23 0109  WBC 17.3*   Lumbar Puncture Labs: Lab Results  Component Value Date/Time   TUBENUMBER 1 07/27/2023 11:05 AM   COLORCSF COLORLESS 07/27/2023 11:05 AM   APPEARCSF  CLEAR 07/27/2023 11:05 AM   SUPERNATCSF COLORLESS 07/27/2023 11:05 AM   RBCCOUNTCSF 53 (H) 07/27/2023 11:05 AM   WBCCSF 130 (HH) 07/27/2023 11:05 AM   POLYSCSF 1 07/27/2023 11:05 AM   LYMPHSCSF 79 07/27/2023 11:05 AM   EOSCSF 1 07/27/2023 11:05 AM   Lab Results  Component Value Date/Time   GLUCCSF 67 07/27/2023 11:05 AM   PROTEINCSF 46 (H) 07/27/2023 11:05 AM   Lab Results  Component Value Date/Time   SDES  07/27/2023 11:05 AM    LUMBAR Performed at [facility], [address], [place], [place] [address]    SDES LUMBAR 07/27/2023 11:05 AM   SPECREQUEST  07/27/2023 11:05 AM    NONE Performed at [facility], [address], [place], [place] [address]    SPECREQUEST NONE 07/27/2023 11:05 AM   GRAMSTAIN RARE WBC SEEN NO ORGANISMS SEEN  07/27/2023 11:05 AM   GRAMSTAIN  07/27/2023 11:05 AM    NO ORGANISMS SEEN WBC SEEN WBC PRESENT,BOTH PMN AND MONONUCLEAR Performed at [facility], [address], [place], [place] [address]    CULT  07/27/2023 11:05 AM    NO GROWTH < 24 HOURS Performed at [facility], [address] [place], [place] [address]    REPTSTATUS PENDING 07/27/2023 11:05 AM   REPTSTATUS 07/27/2023 FINAL 07/27/2023  11:05 AM    Procedures/Studies: MR BRAIN W WO CONTRAST Result Date: 07/28/2023 CLINICAL DATA:  Provided history: Meningitis/CNS infection suspected. EXAM: MRI HEAD WITHOUT AND WITH CONTRAST TECHNIQUE: Multiplanar, multiecho pulse sequences of the brain and surrounding structures were obtained without and with intravenous contrast. CONTRAST:  10mL GADAVIST GADOBUTROL 1 MMOL/ML IV SOLN COMPARISON:  CT venogram head 07/27/2023. CTA head/neck 07/27/2023. Noncontrast head CT 07/25/2023. FINDINGS: Intermittently motion degraded examination. Most notably, the axial T2 sequence is severely motion degraded and the coronal T2 sequence is moderate to severely motion degraded. Within this limitation,  findings are as follows. Brain: Cerebral volume is normal. 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 No cortical encephalomalacia is identified. No significant cerebral white matter disease. There is no acute infarct. No chronic intracranial blood products. No extra-axial fluid collection. No midline shift. No pathologic intracranial enhancement identified. Vascular: Within the limitations of motion degradation, no definite loss of proximal arterial flow voids. Skull and upper cervical spine: No focal worrisome marrow lesion. Sinuses/Orbits: No mass or acute finding within the imaged orbits. 1.6 cm right maxillary sinus mucous retention cyst or polyp. IMPRESSION: 1. Intermittently motion degraded examination, as described. Within this limitation, findings are as follows. 2.  No evidence of an acute intracranial abnormality. 3. Partially empty sella turcica. This finding can reflect incidental anatomic variation, or alternatively, it can be associated with chronic idiopathic intracranial hypertension (pseudotumor cerebri). 4. 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 5. 1.6 cm right maxillary sinus mucous retention cyst or polyp. Electronically Signed   By: Jackey Loge D.O.   On: 07/28/2023 11:35   DG FL GUIDED LUMBAR PUNCTURE Result Date: 07/27/2023 CLINICAL DATA:  Severe headache, nuchal rigidity, and concern for possible meningitis. EXAM: LUMBAR PUNCTURE UNDER FLUOROSCOPY PROCEDURE: An appropriate skin entry site was determined fluoroscopically. Operator donned sterile gloves and mask. Skin site was marked, then prepped with Betadine, draped in usual sterile fashion, and infiltrated locally with 1% lidocaine. A 20 gauge spinal needle advanced into the thecal sac at L4-5 from a left interlaminar approach. Clear colorless CSF spontaneously returned, with opening pressure of 39 cm water with patient in the prone position. Approximately 13 ml CSF were collected and divided among 4 sterile vials for the requested  laboratory studies. Closing pressure of 21 cm of water with patient in the prone position. The needle was then removed. The patient tolerated the procedure well and there were no complications. FLUOROSCOPY: Radiation Exposure Index (as provided by the fluoroscopic device): 6.8 mGy Kerma IMPRESSION: Technically successful lumbar puncture under fluoroscopy. In the prone position, opening pressure was 39 cm of water and closing pressure was 21 cm of water. This exam was performed by Corrin Parker, PA-C, and was supervised and interpreted by Dr. Herbie Baltimore. Electronically Signed   By: Gaylyn Rong M.D.   On: 07/27/2023 11:38   CT ANGIO HEAD NECK W WO CM Result Date: 07/27/2023 CLINICAL DATA:  Provided history: Subdural hemorrhage. Severe headache. Rule out hemorrhage. EXAM: CT ANGIOGRAPHY HEAD AND NECK WITH AND WITHOUT CONTRAST TECHNIQUE: Multidetector CT imaging of the head and neck was performed using the standard protocol during bolus administration of intravenous contrast. Multiplanar CT image reconstructions and MIPs were obtained to evaluate the vascular anatomy. Carotid stenosis measurements (when applicable) are obtained utilizing NASCET criteria, using the distal internal carotid diameter as the denominator. RADIATION DOSE REDUCTION: This exam was performed according to the departmental dose-optimization program which includes automated exposure control, adjustment of the mA and/or kV according  to patient size and/or use of iterative reconstruction technique. CONTRAST:  OMNIPAQUE IOHEXOL 350 MG/ML SOLN COMPARISON:  Non-contrast head CT 07/25/2023. FINDINGS: CT HEAD FINDINGS Brain: Cerebral volume is normal. 1 cm tectal plate lipoma. Partially empty sella turcica. There is no acute intracranial hemorrhage. No demarcated cortical infarct. No extra-axial fluid collection. No midline shift. Vascular: No hyperdense vessel. Skull: No calvarial fracture or aggressive osseous lesion. Sinuses/Orbits:  No orbital mass or acute orbital finding. 1.6 cm mucous retention cyst or polyp within the right maxillary sinus. Review of the MIP images confirms the above findings CTA NECK FINDINGS Moderately motion degraded examination at the level of the mid neck. Within this limitation, findings are as follows. Aortic arch: Common origin of the innominate left common carotid arteries. The visualized thoracic aorta is normal in caliber. Streak/beam hardening artifact arising from a dense contrast bolus partially obscures the right subclavian artery. Within this limitation, there is no appreciable hemodynamically significant innominate or proximal subclavian artery stenosis. Right carotid system: CCA and ICA patent within the neck without definite stenosis. Left carotid system: CCA and ICA patent within the neck without definite stenosis. Vertebral arteries: Patent within the neck Skeleton: No acute fracture or aggressive osseous lesion. Partially imaged levocurvature of the lower cervical and upper thoracic spine. Other neck: Nonspecific mildly enlarged left II lymph node measuring up to 12 mm in short axis. Upper chest: No consolidation within the imaged lung apices. Review of the MIP images confirms the above findings CTA HEAD FINDINGS Anterior circulation: The intracranial internal carotid arteries are patent. The M1 middle cerebral arteries are patent. No M2 proximal branch occlusion or high-grade proximal stenosis. The anterior cerebral arteries are patent. Hypoplastic right A1 segment. No intracranial aneurysm is identified. Posterior circulation: The intracranial vertebral arteries are patent. The basilar artery is patent. The posterior cerebral arteries are patent. Posterior communicating arteries are diminutive or absent, bilaterally. Venous sinuses: See separately reported CT venogram head. Anatomic variants: As described. Review of the MIP images confirms the above findings IMPRESSION: Non-contrast head CT: 1.  No  evidence of an acute intracranial abnormality. 2. Partially empty sella turcica. This finding can reflect incidental anatomic variation, or alternatively, it can be associated with chronic idiopathic intracranial hypertension (pseudotumor cerebri). 3. 1 cm tectal plate lipoma. 4. 1.6 cm right maxillary sinus mucous retention cyst or polyp. CTA neck: 1. The examination is moderately motion degraded at the level of the mid neck. Within this limitation, the common carotid, internal carotid and vertebral arteries are patent within the neck without definite stenosis. 2. Nonspecific mildly enlarged left level II cervical lymph nodes, measuring up to 12 mm in short axis. CTA neck: 1. No proximal intracranial large vessel occlusion or high-grade proximal arterial stenosis. 2. No intracranial aneurysm identified. Electronically Signed   By: Jackey Loge D.O.   On: 07/27/2023 10:43   CT VENOGRAM HEAD Result Date: 07/27/2023 CLINICAL DATA:  Provided history: Dural venous sinus thrombosis suspected. Additional history provided: Headache behind eyes. EXAM: CT VENOGRAM HEAD TECHNIQUE: Venographic phase images of the brain were obtained following the administration of intravenous contrast. Multiplanar reformats and maximum intensity projections were generated. RADIATION DOSE REDUCTION: This exam was performed according to the departmental dose-optimization program which includes automated exposure control, adjustment of the mA and/or kV according to patient size and/or use of iterative reconstruction technique. CONTRAST:  OMNIPAQUE IOHEXOL 350 MG/ML SOLN COMPARISON:  Head CT 07/25/2023. FINDINGS: The superior sagittal sinus, internal cerebral veins, vein of Galen, straight sinus,  transverse sinuses, sigmoid sinuses and visualized jugular veins are patent. There is no appreciable intracranial venous thrombosis. The right transverse and sigmoid dural venous sinuses are dominant. 1 cm tectal plate lipoma again noted.  Partially empty sella turcica. IMPRESSION: 1. No evidence of dural venous sinus thrombosis. 2. Partially empty sella turcica. This finding can reflect incidental anatomic variation, or alternatively, it can be associated with chronic idiopathic intracranial hypertension (pseudotumor cerebri). Electronically Signed   By: Jackey Loge D.O.   On: 07/27/2023 10:31   CT Head Wo Contrast Result Date: 07/25/2023 CLINICAL DATA:  Headache, sudden and severe. Headache behind the eyes EXAM: CT HEAD WITHOUT CONTRAST TECHNIQUE: Contiguous axial images were obtained from the base of the skull through the vertex without intravenous contrast. RADIATION DOSE REDUCTION: This exam was performed according to the departmental dose-optimization program which includes automated exposure control, adjustment of the mA and/or kV according to patient size and/or use of iterative reconstruction technique. COMPARISON:  None Available. FINDINGS: Brain: No evidence of acute infarction, hemorrhage, hydrocephalus, extra-axial collection or mass effect. 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 Vascular: No hyperdense vessel or unexpected calcification. Skull: Normal. Negative for fracture or focal lesion. Sinuses/Orbits: No acute finding.  Clear paranasal sinuses IMPRESSION: No acute finding or specific cause for headache. Electronically Signed   By: Tiburcio Pea M.D.   On: 07/25/2023 05:08    Time coordinating discharge: 45 mins  SIGNED:  Carollee Herter, DO [facility] 07/28/23, 2:51 PM

## 2023-07-28 NOTE — Consult Note (Signed)
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 IDDM Obesity Anxiety   Plan:  DC IV antibiotics DC Decadron Start Valtrex 1 g 3 times daily and she can complete additional 9 days I will check serum for HIV RNA though she should be low risk for this as well as screening for viral hepatitides and check herpes simplex 1 and 2 antibodies I have asked the lab to send out herpes simplex 1 and 2 PCR's on CSF as sometimes the meningitis encephalitis panel and the HSV PCR does not always pick up herpes Our group can certainly call her with positive results and if she needs further explanation we can arrange for an outpatient visit  I will sign off for now please call with further questions.  Principal Problem:   Meningitis   Scheduled Meds:  ARIPiprazole  5 mg Oral Daily   insulin aspart  0-15 Units Subcutaneous TID WC   insulin aspart  0-5 Units Subcutaneous QHS   lithium carbonate  300 mg Oral TID   methadone  100 mg Oral Once   [START ON 07/29/2023] methadone  185 mg Oral Daily   nicotine  21 mg Transdermal Daily   valACYclovir  1,000 mg Oral TID   Continuous Infusions:  lactated ringers 125 mL/hr at 07/28/23 0938   PRN Meds:.acetaminophen **OR** acetaminophen, albuterol, gabapentin, LORazepam, ondansetron **OR** ondansetron (ZOFRAN) IV, QUEtiapine  HPI: Caleb Ngu is a 33 y.o. 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  LP was unsuccessful when attempted in the ER.  She was given Toradol and a dose of Decadron that day.  She then returned on the 14th with worsening headache  and photophobia  Initial attempts at LP were unsuccessful and the patient was given vancomycin and ceftriaxone and acyclovir.  Ultimately she underwent lumbar puncture on the 15th which showed 130 white blood cells with 79% lymphocytes and 19% monocytes with 1% segmented neutrophils a protein of 46 and a glucose of 67.  Cryptococcal antigen was negative in CSF and the meningitis encephalitis panel was also completely negative.  CSF cultures have been without growth to date.  She had an MRI of the brain performed today which wall motion degraded showed no evidence of acute intracranial abnormalities.  A partially empty sella turcica was observed which might be an incidental finding versus association with chronic idiopathic intracranial hypertension..  Today she claims her headache is gone and she is quite eager to go home.  I have brought up the idea that this could be a herpes simplex 2 infection that was not picked up by the meningitis encephalitis panel.  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  She is not aware of ever having had genital herpes though she says that she has had some genital lesions appear over the last week some of which have been intensely pruritic.  I was able to examine 1 of these that is still present and that her vulva with chaperone present and it could be herpetic lesion.  I have asked the microbiology lab to  send out repeat heat herpes simplex 1 and 2 PCR's.  The patient currently is nontoxic-appearing and I think safe to discharge.  I think sending her out on Valtrex 1 g 3 times daily for an additional 9 days and (he already received 1 day of acyclovir) is reasonable.  I also queried as to whether she had taken large amounts of nonsteroidals and she has not been on nonsteroidals preceding onset of her headaches.  She became quite distressed about the possibility of HSV-2 but I again explained that this virus can be  acquired quite early in 1 sexual life and it can be present despite a person not having symptomatic lesions and can reemerge later in life.  Again at this point I think it is safe to send her home.  I did go ahead and order labs to help answer her question of whether she has herpes simplex 2 are not by checking herpes simplex 1 and 2 antibodies.  She says she is screened negative for hepatitis C but I decided to rescreen her for hep C B and for hepatitis A immunity.  I also checked a HIV RNA though HIV infection is exceedingly rare in women who have sex with other women (unless one or more of their partners is sharing needles with someone else who has HIV or also having sex with men)  I am certainly happy to call her if any of her results come back positive and certainly herpes simplex 1 antibody should come back positive.  She is quite anxious to go home I think this is reasonable and she should be safe to test for discharge I expect her leukocytosis observed when she was readmitted on the 14th was due to her Decadron dose.  I have personally spent 82 minutes involved in face-to-face and non-face-to-face activities for this patient on the day of the visit. Professional time spent includes the following activities: Preparing to see the patient (review of tests), Obtaining and/or reviewing separately obtained history (admission/discharge record), Performing a medically appropriate examination and/or evaluation , Ordering medications/tests/procedures, referring and communicating with other health care professionals, Documenting clinical information in the EMR, Independently interpreting results (not separately reported), Communicating results to the patient/family/caregiver, Counseling and educating the patient/family/caregiver and Care coordination (not separately reported).   Evaluation of the patient requires complex antimicrobial therapy evaluation, counseling , isolation needs to reduce disease  transmission and risk assessment and mitigation.      Review of Systems: ROS  Past Medical History:  Diagnosis Date   Asthma    Diabetes (HCC)     Social History   Tobacco Use   Smoking status: Former    Types: Cigarettes   Smokeless tobacco: Never  Vaping Use   Vaping status: Never Used    History reviewed. No pertinent family history. No Known Allergies  OBJECTIVE: Blood pressure 112/74, pulse 70, temperature 99.4 F (37.4 C), temperature source Oral, resp. rate 18, last menstrual period 07/10/2023, SpO2 100%.  Physical Exam  Lab Results Lab Results  Component Value Date   WBC 17.3 (H) 07/27/2023   HGB 11.7 (L) 07/27/2023   HCT 38.8 07/27/2023   MCV 80.2 07/27/2023   PLT 436 (H) 07/27/2023    Lab Results  Component Value Date   CREATININE 0.88 07/27/2023   BUN 10 07/27/2023   NA 137 07/27/2023   K 4.0 07/27/2023   CL 105 07/27/2023   CO2 25 07/27/2023    Lab Results  Component Value Date   ALT  15 09/28/2020   AST 25 09/28/2020   ALKPHOS 43 09/28/2020   BILITOT <0.1 (L) 09/28/2020     Microbiology: Recent Results (from the past 240 hours)  Resp panel by RT-PCR (RSV, Flu A&B, Covid) Anterior Nasal Swab     Status: None   Collection Time: 07/25/23  6:47 AM   Specimen: Anterior Nasal Swab  Result Value Ref Range Status   SARS Coronavirus 2 by RT PCR NEGATIVE NEGATIVE Final    Comment: (NOTE) SARS-CoV-2 target nucleic acids are NOT DETECTED.  The SARS-CoV-2 RNA is generally detectable in upper respiratory specimens during the acute phase of infection. The lowest concentration of SARS-CoV-2 viral copies this assay can detect is 138 copies/mL. A negative result does not preclude SARS-Cov-2 infection and should not be used as the sole basis for treatment or other patient management decisions. A negative result may occur with  improper specimen collection/handling, submission of specimen other than nasopharyngeal swab, presence of viral mutation(s)  within the areas targeted by this assay, and inadequate number of viral copies(<138 copies/mL). A negative result must be combined with clinical observations, patient history, and epidemiological information. The expected result is Negative.  Fact Sheet for Patients:  BloggerCourse.com  Fact Sheet for Healthcare Providers:  SeriousBroker.it  This test is no t yet approved or cleared by the [place] FDA and  has been authorized for detection and/or diagnosis of SARS-CoV-2 by FDA under an Emergency Use Authorization (EUA). This EUA will remain  in effect (meaning this test can be used) for the duration of the COVID-19 declaration under Section 564(b)(1) of the Act, 21 U.S.C.section 360bbb-3(b)(1), unless the authorization is terminated  or revoked sooner.       Influenza A by PCR NEGATIVE NEGATIVE Final   Influenza B by PCR NEGATIVE NEGATIVE Final    Comment: (NOTE) The Xpert Xpress SARS-CoV-2/FLU/RSV plus assay is intended as an aid in the diagnosis of influenza from Nasopharyngeal swab specimens and should not be used as a sole basis for treatment. Nasal washings and aspirates are unacceptable for Xpert Xpress SARS-CoV-2/FLU/RSV testing.  Fact Sheet for Patients: BloggerCourse.com  Fact Sheet for Healthcare Providers: SeriousBroker.it  This test is not yet approved or cleared by the [place] FDA and has been authorized for detection and/or diagnosis of SARS-CoV-2 by FDA under an Emergency Use Authorization (EUA). This EUA will remain in effect (meaning this test can be used) for the duration of the COVID-19 declaration under Section 564(b)(1) of the Act, 21 U.S.C. section 360bbb-3(b)(1), unless the authorization is terminated or revoked.     Resp Syncytial Virus by PCR NEGATIVE NEGATIVE Final    Comment: (NOTE) Fact Sheet for  Patients: BloggerCourse.com  Fact Sheet for Healthcare Providers: SeriousBroker.it  This test is not yet approved or cleared by the [place] FDA and has been authorized for detection and/or diagnosis of SARS-CoV-2 by FDA under an Emergency Use Authorization (EUA). This EUA will remain in effect (meaning this test can be used) for the duration of the COVID-19 declaration under Section 564(b)(1) of the Act, 21 U.S.C. section 360bbb-3(b)(1), unless the authorization is terminated or revoked.  Performed at [facility], [address] [place], [place] [address]   Resp panel by RT-PCR (RSV, Flu A&B, Covid) Anterior Nasal Swab     Status: None   Collection Time: 07/26/23 10:05 PM   Specimen: Anterior Nasal Swab  Result Value Ref Range Status   SARS Coronavirus 2 by RT PCR NEGATIVE NEGATIVE Final  Comment: (NOTE) SARS-CoV-2 target nucleic acids are NOT DETECTED.  The SARS-CoV-2 RNA is generally detectable in upper respiratory specimens during the acute phase of infection. The lowest concentration of SARS-CoV-2 viral copies this assay can detect is 138 copies/mL. A negative result does not preclude SARS-Cov-2 infection and should not be used as the sole basis for treatment or other patient management decisions. A negative result may occur with  improper specimen collection/handling, submission of specimen other than nasopharyngeal swab, presence of viral mutation(s) within the areas targeted by this assay, and inadequate number of viral copies(<138 copies/mL). A negative result must be combined with clinical observations, patient history, and epidemiological information. The expected result is Negative.  Fact Sheet for Patients:  BloggerCourse.com  Fact Sheet for Healthcare Providers:  SeriousBroker.it  This test is no t yet approved or cleared by the [place] FDA and  has been authorized for detection and/or diagnosis of SARS-CoV-2 by FDA under an Emergency Use Authorization (EUA). This EUA will remain  in effect (meaning this test can be used) for the duration of the COVID-19 declaration under Section 564(b)(1) of the Act, 21 U.S.C.section 360bbb-3(b)(1), unless the authorization is terminated  or revoked sooner.       Influenza A by PCR NEGATIVE NEGATIVE Final   Influenza B by PCR NEGATIVE NEGATIVE Final    Comment: (NOTE) The Xpert Xpress SARS-CoV-2/FLU/RSV plus assay is intended as an aid in the diagnosis of influenza from Nasopharyngeal swab specimens and should not be used as a sole basis for treatment. Nasal washings and aspirates are unacceptable for Xpert Xpress SARS-CoV-2/FLU/RSV testing.  Fact Sheet for Patients: BloggerCourse.com  Fact Sheet for Healthcare Providers: SeriousBroker.it  This test is not yet approved or cleared by the [place] FDA and has been authorized for detection and/or diagnosis of SARS-CoV-2 by FDA under an Emergency Use Authorization (EUA). This EUA will remain in effect (meaning this test can be used) for the duration of the COVID-19 declaration under Section 564(b)(1) of the Act, 21 U.S.C. section 360bbb-3(b)(1), unless the authorization is terminated or revoked.     Resp Syncytial Virus by PCR NEGATIVE NEGATIVE Final    Comment: (NOTE) Fact Sheet for Patients: BloggerCourse.com  Fact Sheet for Healthcare Providers: SeriousBroker.it  This test is not yet approved or cleared by the [place] FDA and has been authorized for detection and/or diagnosis of SARS-CoV-2 by FDA under an Emergency Use Authorization (EUA). This EUA will remain in effect (meaning this test can be used) for the duration of the COVID-19 declaration under Section 564(b)(1) of the Act, 21 U.S.C. section  360bbb-3(b)(1), unless the authorization is terminated or revoked.  Performed at [facility], [address] [place], [place] [address]   Respiratory (~20 pathogens) panel by PCR     Status: None   Collection Time: 07/26/23 10:05 PM   Specimen: Nasopharyngeal Swab; Respiratory  Result Value Ref Range Status   Adenovirus NOT DETECTED NOT DETECTED Final   Coronavirus 229E NOT DETECTED NOT DETECTED Final    Comment: (NOTE) The Coronavirus on the Respiratory Panel, DOES NOT test for the novel  Coronavirus (2019 nCoV)    Coronavirus HKU1 NOT DETECTED NOT DETECTED Final   Coronavirus NL63 NOT DETECTED NOT DETECTED Final   Coronavirus OC43 NOT DETECTED NOT DETECTED Final   Metapneumovirus NOT DETECTED NOT DETECTED Final   Rhinovirus / Enterovirus NOT DETECTED NOT DETECTED Final   Influenza A NOT DETECTED NOT DETECTED Final   Influenza B NOT DETECTED  NOT DETECTED Final   Parainfluenza Virus 1 NOT DETECTED NOT DETECTED Final   Parainfluenza Virus 2 NOT DETECTED NOT DETECTED Final   Parainfluenza Virus 3 NOT DETECTED NOT DETECTED Final   Parainfluenza Virus 4 NOT DETECTED NOT DETECTED Final   Respiratory Syncytial Virus NOT DETECTED NOT DETECTED Final   Bordetella pertussis NOT DETECTED NOT DETECTED Final   Bordetella Parapertussis NOT DETECTED NOT DETECTED Final   Chlamydophila pneumoniae NOT DETECTED NOT DETECTED Final   Mycoplasma pneumoniae NOT DETECTED NOT DETECTED Final    Comment: Performed at [facility], [address] [place], [place] [address]  CSF culture w Gram Stain     Status: None (Preliminary result)   Collection Time: 07/27/23 11:05 AM   Specimen: Lumbar Puncture; Cerebrospinal Fluid  Result Value Ref Range Status   Specimen Description   Final    LUMBAR Performed at [facility], [address] [place], [place] [address]    Special Requests   Final    NONE Performed at [facility], [address], [place], [place] [address]    Gram Stain RARE WBC SEEN NO ORGANISMS SEEN   Final   Culture   Final    NO GROWTH < 24 HOURS Performed at [facility], [address] [place], [place] [address]    Report Status PENDING  Incomplete  Gram stain     Status: None   Collection Time: 07/27/23 11:05 AM   Specimen: Lumbar Puncture; Cerebrospinal Fluid  Result Value Ref Range Status   Specimen Description LUMBAR  Final   Special Requests NONE  Final   Gram Stain   Final    NO ORGANISMS SEEN WBC SEEN WBC PRESENT,BOTH PMN AND MONONUCLEAR Performed at [facility], [address] [place], [place] [address]    Report Status 07/27/2023 FINAL  Final    Acey Lav, MD [facility] [phone] pager  07/28/2023, 2:08 PM

## 2023-07-29 LAB — VDRL, CSF: VDRL Quant, CSF: NONREACTIVE

## 2023-07-29 LAB — HIV-1 RNA QUANT-NO REFLEX-BLD
HIV 1 RNA Quant: <20 {copies}/mL
LOG10 HIV-1 RNA: UNDETERMINED {Log}

## 2023-07-30 ENCOUNTER — Telehealth: Payer: Self-pay

## 2023-07-30 LAB — HSV 1 ANTIBODY, IGG: HSV 1 Glycoprotein G Ab, IgG: REACTIVE — AB

## 2023-07-30 LAB — CSF CULTURE W GRAM STAIN: Culture: NO GROWTH

## 2023-07-30 LAB — HCV AB W REFLEX TO QUANT PCR: HCV Ab: NONREACTIVE

## 2023-07-30 LAB — MISC LABCORP TEST (SEND OUT): Labcorp test code: 139800

## 2023-07-30 LAB — HCV INTERPRETATION

## 2023-07-30 LAB — HEPATITIS B SURFACE ANTIBODY, QUANTITATIVE: Hep B S AB Quant (Post): 3851 m[IU]/mL

## 2023-07-30 LAB — HSV 2 ANTIBODY, IGG: HSV 2 Glycoprotein G Ab, IgG: REACTIVE — AB

## 2023-07-30 NOTE — Telephone Encounter (Signed)
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 I explained to her in the hospital as well as her fiance that this could have been acquired decades ago as there are large numbers of ppl who have this virus but dont know because they do not experience outbreaks ----- Message ----- From: Leory Plowman, [facility] [place] Sent: 07/28/2023   7:39 PM EST To: Randall Hiss, MD

## 2023-07-30 NOTE — Telephone Encounter (Signed)
Called patient to discuss results, phone number does not belong to patient. They provided RN with patient's number, [phone].  Spoke with [place] and discussed positive HSV results. She was very upset by this news. We spent time discussing the asymptomatic nature of many HSV infections and that there is no way for [place] to provide her with a timeline of when the infection was acquired.   She has additional concerns and would like to see Dr. Daiva Eves to discuss further.   She is worried about what this diagnosis means in terms of her relationship with her partner. Encouraged her to bring her partner to the appointment with her. She reports they're no longer together now.  Sandie Ano, RN

## 2023-07-31 LAB — PATHOLOGIST SMEAR REVIEW

## 2023-08-05 DIAGNOSIS — Z87891 Personal history of nicotine dependence: Secondary | ICD-10-CM | POA: Insufficient documentation

## 2023-08-05 DIAGNOSIS — F1190 Opioid use, unspecified, uncomplicated: Secondary | ICD-10-CM | POA: Insufficient documentation

## 2023-08-05 NOTE — Progress Notes (Deleted)
 Subjective:    Patient ID: Melissa Castillo, female    DOB: [birth year], 33 y.o.   MRN: [id number]  HPI   Past Medical History:  Diagnosis Date   Asthma    Diabetes (HCC)     No past surgical history on file.  No family history on file.    Social History   Socioeconomic History   Marital status: Single    Spouse name: Not on file   Number of children: Not on file   Years of education: Not on file   Highest education level: Not on file  Occupational History   Not on file  Tobacco Use   Smoking status: Former    Types: Cigarettes   Smokeless tobacco: Never  Vaping Use   Vaping status: Never Used  Substance and Sexual Activity   Alcohol use: Not on file   Drug use: Not on file   Sexual activity: Not on file  Other Topics Concern   Not on file  Social History Narrative   Not on file   Social Drivers of Health   Financial Resource Strain: Patient Declined (01/08/2023)   Received from [facility]   Overall Financial Resource Strain (CARDIA)    Difficulty of Paying Living Expenses: Patient declined  Food Insecurity: No Food Insecurity (07/28/2023)   Hunger Vital Sign    Worried About Running Out of Food in the Last Year: Never true    Ran Out of Food in the Last Year: Never true  Transportation Needs: No Transportation Needs (07/28/2023)   PRAPARE - Administrator, Civil Service (Medical): No    Lack of Transportation (Non-Medical): No  Physical Activity: Not on file  Stress: Not on file  Social Connections: Not on file    No Known Allergies   Current Outpatient Medications:    albuterol (VENTOLIN HFA) 108 (90 Base) MCG/ACT inhaler, Inhale 1-2 puffs into the lungs every 6 (six) hours as needed for wheezing or shortness of breath. (Patient taking differently: Inhale 2 puffs into the lungs every 6 (six) hours as needed for wheezing or shortness of breath.), Disp: 1 each, Rfl: 0   ARIPiprazole (ABILIFY) 5 MG tablet, Take 5 mg by mouth at bedtime.,  Disp: , Rfl:    citalopram (CELEXA) 20 MG tablet, Take 20 mg by mouth daily., Disp: , Rfl:    cloNIDine (CATAPRES) 0.1 MG tablet, Take 0.1 mg by mouth 3 (three) times daily., Disp: , Rfl:    fenofibrate (TRICOR) 145 MG tablet, Take 145 mg by mouth daily., Disp: , Rfl:    gabapentin (NEURONTIN) 100 MG capsule, Take 100 mg by mouth 3 (three) times daily., Disp: , Rfl:    hydrOXYzine (ATARAX) 25 MG tablet, Take 25-50 mg by mouth 3 (three) times daily as needed., Disp: , Rfl:    LINZESS 72 MCG capsule, Take 72 mcg by mouth daily as needed (constipation)., Disp: , Rfl:    lithium 300 MG tablet, Take 300 mg by mouth 3 (three) times daily., Disp: , Rfl:    metFORMIN (GLUCOPHAGE) 500 MG tablet, Take 500 mg by mouth daily., Disp: , Rfl:    methadone (DOLOPHINE) 10 MG/5ML solution, Take 185 mg by mouth daily., Disp: , Rfl:    nicotine (NICODERM CQ - DOSED IN MG/24 HOURS) 21 mg/24hr patch, Place 21 mg onto the skin daily., Disp: , Rfl:    nicotine polacrilex (NICORETTE) 4 MG gum, Take 4 mg by mouth as needed for smoking cessation., Disp: , Rfl:  ondansetron (ZOFRAN) 4 MG tablet, Take 1 tablet (4 mg total) by mouth every 6 (six) hours as needed for nausea or vomiting., Disp: 60 tablet, Rfl: 0   QUEtiapine (SEROQUEL) 50 MG tablet, Take 50 mg by mouth at bedtime., Disp: , Rfl:    TRULICITY 0.75 MG/0.5ML SOAJ, Inject 0.75 mg into the skin once a week., Disp: , Rfl:    valACYclovir (VALTREX) 1000 MG tablet, Take 500 mg by mouth daily as needed (cold sores)., Disp: , Rfl:    valACYclovir (VALTREX) 1000 MG tablet, Take 1 tablet (1,000 mg total) by mouth 3 (three) times daily for 9 days., Disp: 27 tablet, Rfl: 0   VRAYLAR 3 MG capsule, Take 3 mg by mouth daily., Disp: , Rfl:     Review of Systems     Objective:   Physical Exam        Assessment & Plan:

## 2023-08-06 ENCOUNTER — Inpatient Hospital Stay: Payer: 59 | Admitting: Infectious Disease

## 2023-08-06 DIAGNOSIS — Z87898 Personal history of other specified conditions: Secondary | ICD-10-CM

## 2023-08-06 DIAGNOSIS — Z79891 Long term (current) use of opiate analgesic: Secondary | ICD-10-CM

## 2023-08-06 DIAGNOSIS — G030 Nonpyogenic meningitis: Secondary | ICD-10-CM

## 2023-08-19 ENCOUNTER — Ambulatory Visit: Admitting: Neurology

## 2023-08-19 ENCOUNTER — Encounter: Payer: Self-pay | Admitting: Neurology

## 2023-09-30 ENCOUNTER — Emergency Department (HOSPITAL_COMMUNITY)

## 2023-09-30 ENCOUNTER — Other Ambulatory Visit: Payer: Self-pay

## 2023-09-30 ENCOUNTER — Emergency Department (HOSPITAL_COMMUNITY)
Admission: EM | Admit: 2023-09-30 | Discharge: 2023-09-30 | Disposition: A | Discharge status: Home / Self Care | Attending: Emergency Medicine | Admitting: Emergency Medicine

## 2023-09-30 ENCOUNTER — Encounter (HOSPITAL_COMMUNITY): Payer: Self-pay | Admitting: Emergency Medicine

## 2023-09-30 DIAGNOSIS — Y92410 Unspecified street and highway as the place of occurrence of the external cause: Secondary | ICD-10-CM | POA: Diagnosis not present

## 2023-09-30 DIAGNOSIS — S8001XA Contusion of right knee, initial encounter: Secondary | ICD-10-CM | POA: Insufficient documentation

## 2023-09-30 DIAGNOSIS — S8000XA Contusion of unspecified knee, initial encounter: Secondary | ICD-10-CM

## 2023-09-30 DIAGNOSIS — S8002XA Contusion of left knee, initial encounter: Secondary | ICD-10-CM | POA: Diagnosis not present

## 2023-09-30 DIAGNOSIS — M546 Pain in thoracic spine: Secondary | ICD-10-CM | POA: Diagnosis not present

## 2023-09-30 DIAGNOSIS — M545 Low back pain, unspecified: Secondary | ICD-10-CM | POA: Diagnosis not present

## 2023-09-30 DIAGNOSIS — S8991XA Unspecified injury of right lower leg, initial encounter: Secondary | ICD-10-CM | POA: Diagnosis present

## 2023-09-30 DIAGNOSIS — M549 Dorsalgia, unspecified: Secondary | ICD-10-CM

## 2023-09-30 MED ORDER — OXYCODONE HCL 5 MG PO TABS
10.0000 mg | ORAL_TABLET | ORAL | Status: AC
  Administered 2023-09-30: 10 mg via ORAL
  Filled 2023-09-30: qty 2
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
## 2023-10-08 ENCOUNTER — Encounter: Payer: Self-pay | Admitting: Neurology

## 2023-10-08 ENCOUNTER — Ambulatory Visit: Admitting: Neurology

## 2023-10-08 ENCOUNTER — Telehealth: Payer: Self-pay | Admitting: Neurology

## 2023-10-08 NOTE — Telephone Encounter (Signed)
 Patient has no showed 2 New Patient appointments now. 08/19/23 and today 10/08/23. Ok for dismissal?
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
## 2023-10-09 ENCOUNTER — Ambulatory Visit: Admitting: Neurology

## 2023-11-12 ENCOUNTER — Ambulatory Visit: Admitting: Neurology

## 2023-11-12 ENCOUNTER — Telehealth: Payer: Self-pay | Admitting: Neurology

## 2023-11-12 ENCOUNTER — Encounter: Payer: Self-pay | Admitting: Neurology

## 2023-11-12 NOTE — Telephone Encounter (Signed)
 Please follow dismissal protocol as per our No Show Policy.  At this point, she has had NS x 3 in this office.

## 2023-11-12 NOTE — Telephone Encounter (Signed)
 Pt called asking if she could r/s, message in appointment note about her not being r/s was relayed to her.  It was explained she could not be rescheduled, pt said ok and call ended

## 2023-11-13 ENCOUNTER — Encounter: Payer: Self-pay | Admitting: Neurology

## 2024-04-30 ENCOUNTER — Telehealth (HOSPITAL_COMMUNITY): Payer: Self-pay | Admitting: Professional

## 2024-04-30 NOTE — Telephone Encounter (Signed)
 See call log

## 2024-05-06 ENCOUNTER — Telehealth (HOSPITAL_COMMUNITY): Payer: Self-pay | Admitting: Licensed Clinical Social Worker

## 2024-05-06 NOTE — Telephone Encounter (Signed)
 See call log

## 2024-05-15 ENCOUNTER — Telehealth (HOSPITAL_COMMUNITY): Payer: Self-pay | Admitting: Licensed Clinical Social Worker

## 2024-05-15 NOTE — Telephone Encounter (Signed)
 Cln made 3rd attempt to contact pt in response to a Vm she left reporting a provider referred her to our services. Cln spoke with pt who states she is seeking outpatient services for her and her children. Pt states she is unable to do the time commitment of PHP and was not aware that was who she was calling. Cln will ask [facility] front desk to reach out to pt to help schedule appropriate services. Pt reports understanding. Pt denies SI/HI>

## 2024-06-02 ENCOUNTER — Encounter (HOSPITAL_COMMUNITY): Payer: Self-pay

## 2024-06-02 ENCOUNTER — Emergency Department (HOSPITAL_COMMUNITY)
Admission: EM | Admit: 2024-06-02 | Discharge: 2024-06-02 | Discharge status: Against Medical Advice | Source: Home / Self Care

## 2024-06-02 ENCOUNTER — Other Ambulatory Visit: Payer: Self-pay

## 2024-06-02 ENCOUNTER — Emergency Department (HOSPITAL_COMMUNITY): Admission: EM | Admit: 2024-06-02 | Discharge: 2024-06-02 | Disposition: A | Discharge status: Home / Self Care

## 2024-06-02 ENCOUNTER — Emergency Department (HOSPITAL_COMMUNITY)

## 2024-06-02 DIAGNOSIS — Z5321 Procedure and treatment not carried out due to patient leaving prior to being seen by health care provider: Secondary | ICD-10-CM | POA: Insufficient documentation

## 2024-06-02 DIAGNOSIS — J101 Influenza due to other identified influenza virus with other respiratory manifestations: Secondary | ICD-10-CM | POA: Insufficient documentation

## 2024-06-02 DIAGNOSIS — Z91040 Latex allergy status: Secondary | ICD-10-CM | POA: Diagnosis not present

## 2024-06-02 DIAGNOSIS — J45909 Unspecified asthma, uncomplicated: Secondary | ICD-10-CM | POA: Diagnosis not present

## 2024-06-02 DIAGNOSIS — J069 Acute upper respiratory infection, unspecified: Secondary | ICD-10-CM | POA: Diagnosis present

## 2024-06-02 DIAGNOSIS — R509 Fever, unspecified: Secondary | ICD-10-CM | POA: Diagnosis present

## 2024-06-02 LAB — COMPREHENSIVE METABOLIC PANEL WITH GFR
ALT: 17 U/L (ref 0–44)
AST: 30 U/L (ref 15–41)
Albumin: 4.1 g/dL (ref 3.5–5.0)
Alkaline Phosphatase: 69 U/L (ref 38–126)
Anion gap: 11 (ref 5–15)
BUN: 6 mg/dL (ref 6–20)
CO2: 23 mmol/L (ref 22–32)
Calcium: 10 mg/dL (ref 8.9–10.3)
Chloride: 100 mmol/L (ref 98–111)
Creatinine, Ser: 0.74 mg/dL (ref 0.44–1.00)
GFR, Estimated: >60 mL/min
Glucose, Bld: 130 mg/dL — ABNORMAL HIGH (ref 70–99)
Potassium: 4.5 mmol/L (ref 3.5–5.1)
Sodium: 135 mmol/L (ref 135–145)
Total Bilirubin: <0.2 mg/dL (ref 0.0–1.2)
Total Protein: 8.2 g/dL — ABNORMAL HIGH (ref 6.5–8.1)

## 2024-06-02 LAB — CBC WITH DIFFERENTIAL/PLATELET
Abs Immature Granulocytes: 0.06 K/uL (ref 0.00–0.07)
Basophils Absolute: 0 K/uL (ref 0.0–0.1)
Basophils Relative: 0 %
Eosinophils Absolute: 0 K/uL (ref 0.0–0.5)
Eosinophils Relative: 0 %
HCT: 41.3 % (ref 36.0–46.0)
Hemoglobin: 12.8 g/dL (ref 12.0–15.0)
Immature Granulocytes: 0 %
Lymphocytes Relative: 6 %
Lymphs Abs: 0.8 K/uL (ref 0.7–4.0)
MCH: 24.2 pg — ABNORMAL LOW (ref 26.0–34.0)
MCHC: 31 g/dL (ref 30.0–36.0)
MCV: 77.9 fL — ABNORMAL LOW (ref 80.0–100.0)
Monocytes Absolute: 1.1 K/uL — ABNORMAL HIGH (ref 0.1–1.0)
Monocytes Relative: 8 %
Neutro Abs: 11.4 K/uL — ABNORMAL HIGH (ref 1.7–7.7)
Neutrophils Relative %: 86 %
Platelets: 425 K/uL — ABNORMAL HIGH (ref 150–400)
RBC: 5.3 MIL/uL — ABNORMAL HIGH (ref 3.87–5.11)
RDW: 17.2 % — ABNORMAL HIGH (ref 11.5–15.5)
WBC: 13.4 K/uL — ABNORMAL HIGH (ref 4.0–10.5)
nRBC: 0 % (ref 0.0–0.2)

## 2024-06-02 LAB — RESP PANEL BY RT-PCR (RSV, FLU A&B, COVID)  RVPGX2
Influenza A by PCR: POSITIVE — AB
Influenza A by PCR: POSITIVE — AB
Influenza B by PCR: NEGATIVE
Influenza B by PCR: NEGATIVE
Resp Syncytial Virus by PCR: NEGATIVE
Resp Syncytial Virus by PCR: NEGATIVE
SARS Coronavirus 2 by RT PCR: NEGATIVE
SARS Coronavirus 2 by RT PCR: NEGATIVE

## 2024-06-02 LAB — MAGNESIUM: Magnesium: 1.8 mg/dL (ref 1.7–2.4)

## 2024-06-02 MED ORDER — OSELTAMIVIR PHOSPHATE 75 MG PO CAPS: 75.0000 mg | ORAL_CAPSULE | Freq: Two times a day (BID) | ORAL | 0 refills | Status: AC

## 2024-06-02 MED ORDER — ACETAMINOPHEN 500 MG PO TABS: 1000.0000 mg | ORAL_TABLET | Freq: Once | ORAL | Status: DC

## 2024-06-02 MED ORDER — ONDANSETRON 4 MG PO TBDP
4.0000 mg | ORAL_TABLET | Freq: Once | ORAL | Status: AC | PRN
  Administered 2024-06-02: 4 mg via ORAL
  Filled 2024-06-02: qty 1

## 2024-06-02 MED ORDER — OXYCODONE HCL 5 MG PO TABS: 5.0000 mg | ORAL_TABLET | Freq: Once | ORAL | Status: DC

## 2024-06-02 MED ORDER — ACETAMINOPHEN 500 MG PO TABS
1000.0000 mg | ORAL_TABLET | Freq: Once | ORAL | Status: AC
  Administered 2024-06-02: 1000 mg via ORAL
  Filled 2024-06-02: qty 2
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
## 2024-06-02 NOTE — ED Notes (Signed)
 Pt with visitor, reports leaving due to long wait times.

## 2024-06-02 NOTE — Discharge Instructions (Addendum)
 Thank you for visiting the Emergency Department today. It was a pleasure to be part of your healthcare team.  Your test results were positive for influenza.  You have been prescribed Tamiflu - you should take your medications as directed. If you have any questions about your medicines, please call your pharmacy or healthcare provider. At home, rest, hydrate, resume normal diet as tolerated, and utilize Tylenol  and ibuprofen as needed for fever and pain relief. It is important to watch for warning signs such as worsening pain, fever, trouble breathing, or chest pain. If any of these happen, return to the Emergency Department or call 911. Thank you for trusting us  with your health.

## 2024-06-02 NOTE — ED Provider Notes (Signed)
 " [place] [facility] AT [facility] Provider Note   CSN: [id number] Arrival date & time: 06/02/24  9087     Patient presents with: URI   Melissa Castillo is a 33 y.o. female with a history of asthma who presents to the ED with flulike symptoms that began yesterday afternoon. The patient states that her son recently tested positive for the flu and yesterday evening she started experiencing symptoms such as cough, fever, chills, myalgias, and a wax/wane headache.  Patient states that she has been taking ibuprofen at home with moderate symptomatic relief.  Patient reports no nausea, vomiting, or diarrhea. The patient denies chest pain or shortness of breath. The patient denies any other neurological symptoms or focal deficits with wax/wane headache.  The patient is in no acute distress.   URI Presenting symptoms: cough        Prior to Admission medications  Medication Sig Start Date End Date Taking? Authorizing Provider  oseltamivir  (TAMIFLU ) 75 MG capsule Take 1 capsule (75 mg total) by mouth 2 (two) times daily for 5 days. 06/02/24 06/07/24 Yes Yulonda Wheeling L, PA  albuterol  (VENTOLIN  HFA) 108 (90 Base) MCG/ACT inhaler Inhale 1-2 puffs into the lungs every 6 (six) hours as needed for wheezing or shortness of breath. Patient taking differently: Inhale 2 puffs into the lungs every 6 (six) hours as needed for wheezing or shortness of breath. 09/28/20   Sponseller, Pleasant R, PA-C  ARIPiprazole  (ABILIFY ) 5 MG tablet Take 5 mg by mouth at bedtime. 06/28/23   [provider]  citalopram  (CELEXA ) 20 MG tablet Take 20 mg by mouth daily.    [provider]  cloNIDine (CATAPRES) 0.1 MG tablet Take 0.1 mg by mouth 3 (three) times daily. 06/19/23   [provider]  fenofibrate (TRICOR) 145 MG tablet Take 145 mg by mouth daily. 07/11/23   [provider]  gabapentin  (NEURONTIN ) 100 MG capsule Take 100 mg by mouth 3 (three) times daily.    [provider]  hydrOXYzine (ATARAX) 25 MG tablet Take 25-50 mg by mouth 3 (three) times daily as needed. 06/19/23   [provider]  LINZESS 72 MCG capsule Take 72 mcg by mouth daily as needed (constipation). 01/02/23   [provider]  lithium  300 MG tablet Take 300 mg by mouth 3 (three) times daily. 07/02/23   [provider]  metFORMIN (GLUCOPHAGE) 500 MG tablet Take 500 mg by mouth daily. 09/13/20   [provider]  methadone  (DOLOPHINE ) 10 MG/5ML solution Take 185 mg by mouth daily.    [provider]  nicotine  (NICODERM CQ  - DOSED IN MG/24 HOURS) 21 mg/24hr patch Place 21 mg onto the skin daily.    [provider]  nicotine  polacrilex (NICORETTE) 4 MG gum Take 4 mg by mouth as needed for smoking cessation.    [provider]  QUEtiapine  (SEROQUEL ) 50 MG tablet Take 50 mg by mouth at bedtime. 07/13/20   [provider]  TRULICITY 0.75 MG/0.5ML SOAJ Inject 0.75 mg into the skin once a week.    [provider]  valACYclovir  (VALTREX ) 1000 MG tablet Take 500 mg by mouth daily as needed (cold sores). 01/02/23   [provider]  VRAYLAR 3 MG capsule Take 3 mg by mouth daily. 06/19/23   [provider]    Allergies: Latex    Review of Systems  Respiratory:  Positive for cough.     Updated Vital Signs BP (!) 141/96 (BP Location:  Right Arm)   Pulse 98   Temp 98.2 F (36.8 C) (Oral)   Resp 18   SpO2 96%   Physical Exam Vitals and nursing note reviewed.  Constitutional:      General: She is not in acute distress.    Appearance: Normal appearance.  HENT:     Head: Normocephalic and atraumatic.     Right Ear: Tympanic membrane and ear canal normal.     Left Ear: Tympanic membrane and ear canal normal.     Nose: Congestion and rhinorrhea present. Rhinorrhea is clear.     Mouth/Throat:     Mouth: Mucous membranes are moist.     Pharynx: Posterior oropharyngeal erythema present. No pharyngeal  swelling, oropharyngeal exudate or uvula swelling.     Tonsils: No tonsillar exudate or tonsillar abscesses.  Eyes:     Extraocular Movements: Extraocular movements intact.     Conjunctiva/sclera: Conjunctivae normal.     Pupils: Pupils are equal, round, and reactive to light.  Cardiovascular:     Rate and Rhythm: Normal rate and regular rhythm.     Pulses: Normal pulses.  Pulmonary:     Effort: Pulmonary effort is normal. No respiratory distress.     Breath sounds: Normal breath sounds.     Comments: Patient has no difficulty speaking in complete sentences. Abdominal:     General: Abdomen is flat.     Palpations: Abdomen is soft.     Tenderness: There is no abdominal tenderness.  Musculoskeletal:        General: Normal range of motion.     Cervical back: Normal range of motion.  Lymphadenopathy:     Cervical: Cervical adenopathy present.  Skin:    General: Skin is warm and dry.     Capillary Refill: Capillary refill takes less than 2 seconds.  Neurological:     General: No focal deficit present.     Mental Status: She is alert. Mental status is at baseline.     Comments: Patient alert and oriented.  Speech clear and appropriate.  No aphasia or dysarthria.   Cranial nerves III through XII intact: Motor strength 5-5 in all extremities with normal tone and no pronator drift.   Sensation intact to light touch in upper and lower extremities bilaterally.  No focal neurologic deficits appreciated.   Psychiatric:        Mood and Affect: Mood normal.     (all labs ordered are listed, but only abnormal results are displayed) Labs Reviewed  RESP PANEL BY RT-PCR (RSV, FLU A&B, COVID)  [id number] - Abnormal; Notable for the following components:      Result Value   Influenza A by PCR POSITIVE (*)    All other components within normal limits    EKG: None  Radiology: DG Chest 2 View Result Date: 06/02/2024 EXAM: 2 VIEW(S) XRAY OF THE CHEST 06/02/2024 06:20:00 PM COMPARISON: 4 / 20 /  22. CLINICAL HISTORY: fever, cough FINDINGS: LUNGS AND PLEURA: No focal pulmonary opacity. No pleural effusion. No pneumothorax. HEART AND MEDIASTINUM: No acute abnormality of the cardiac and mediastinal silhouettes. BONES AND SOFT TISSUES: No acute osseous abnormality. IMPRESSION: 1. No acute cardiopulmonary process. Electronically signed by: Norman Gatlin MD 06/02/2024 07:46 PM EST RP Workstation: [id number]     Procedures   Medications Ordered in the ED  acetaminophen  (TYLENOL ) tablet 1,000 mg (1,000 mg Oral Given 06/02/24 0951)  Medical Decision Making  Patient presents to the ED for: Flulike symptoms This involves an extensive number of treatment options  Differential diagnosis includes: Infectious etiology Co-morbid conditions: Asthma  Clinical Course as of 06/02/24 2147  Tue Jun 02, 2024  0935 Temp: 98.9 F (37.2 C) Afebrile, vital stable, patient in no acute distress [ML]  1118 Resp panel by RT-PCR (RSV, Flu A&B, Covid) Anterior Nasal Swab(!) Flu A + [ML]  1118 Patient given Tylenol  1000 mg for pain relief  [ML]    Clinical Course User Index [ML] Willma Duwaine CROME, PA    Data Reviewed / Actions Taken: Labs ordered/reviewed with my independent interpretation in ED course above.  Management / Treatments: See ED course above for medications, treatments administered, and clinical rationale.   Reevaluation of the patient after these medicines showed that the patient improved. I have reviewed the patients home medicines and have made adjustments as needed  Test Considered/Diagnostic tools:  Additional diagnostic testing such as further laboratory studies and imaging were considered however based on the patients presenting symptoms, and initial clinical assessment were deemed not necessary at this time.  ED Course / Reassessments: Problem List: Influenza A 33 year old female presented for flulike symptoms. Initial assessment included  history, physical exam, and review of prior medical records. Laboratory testing was obtained given clinical presentation and viral PCR testing returned positive for influenza A.  There is low clinical suspicion for bacterial etiology requiring antibiotics at this time based on positive PCR testing, reassuring vital signs, and overall clinical appearance.  Imaging and additional laboratory studies were not indicated at this time.  The patient was treated symptomatically with antipyretics and supportive care.  Antiviral therapy was discussed, including risks and benefit and was initiated based on symptom duration and shared decision making.  The patient remained stable during the ED course and was deemed appropriate for outpatient management.  Discharge planning include strict return precautions for worsening respiratory symptoms, persistent high fevers, chest pain, inability to tolerate oral intake, or new neurological symptoms.  The patient was advised on continued supportive care at home, including hydration, rest, and antipyretic use, as well as isolation precautions to limit transmission.  Patient response: improved with pain management  Serial reassessments performed: Yes    Disposition: Disposition: Discharge with close follow-up with PCP for further evaluation and care Rationale for disposition: stable for discharge  The disposition plan and rationale were discussed with the patient at the bedside, all questions were addressed, and the patient demonstrated understanding.  This note was produced using Electronics Engineer. While I have reviewed and verified all clinical information, transcription errors may remain.      Final diagnoses:  Viral upper respiratory illness    ED Discharge Orders          Ordered    oseltamivir  (TAMIFLU ) 75 MG capsule  2 times daily        06/02/24 [address] [place] 06/02/24 2151    Neysa Caron PARAS, DO 06/03/24  [phone]  "

## 2024-06-02 NOTE — ED Provider Triage Note (Signed)
 Emergency Medicine Provider Triage Evaluation Note  Melissa Castillo , a 33 y.o. female  was evaluated in triage.  Pt complains of significant headache, flulike symptoms that started yesterday.  Resting with the lights off.  Good range of motion of the neck  Review of Systems  Positive: As above Negative: As above  Physical Exam  BP (!) 146/96 (BP Location: Left Arm)   Pulse (!) 104   Temp 99.3 F (37.4 C)   Resp (!) 24   Ht 5' 7 (1.702 m)   Wt 90.7 kg   SpO2 93%   BMI 31.32 kg/m  Gen:   Awake, no distress   Resp:  Normal effort  MSK:   Moves extremities without difficulty  Other:    Medical Decision Making  Medically screening exam initiated at 5:36 PM.  Appropriate orders placed.  Melissa Castillo was informed that the remainder of the evaluation will be completed by another provider, this initial triage assessment does not replace that evaluation, and the importance of remaining in the ED until their evaluation is complete.    Hildegard Loge, PA-C 06/02/24 1737

## 2024-06-02 NOTE — ED Triage Notes (Signed)
 Pt states she has known sick contact with son who was diagnosed with the flu. Pt states she is now having similar symptoms, c/o cough, fever, chills, body aches, headache. Tmax at home 102. Took ibuprofen at 0800 this morning.

## 2024-06-02 NOTE — ED Notes (Signed)
 Pt requesting pain medications, message sent to EDP
